# Patient Record
Sex: Female | Born: 1985 | Race: Asian | Hispanic: No | Marital: Married | Smoking: Never smoker
Health system: Southern US, Community
[De-identification: ages and names within clinical notes are randomized; demographics above are authoritative.]

## PROBLEM LIST (undated history)

## (undated) DIAGNOSIS — R51 Headache: Secondary | ICD-10-CM

## (undated) DIAGNOSIS — E559 Vitamin D deficiency, unspecified: Secondary | ICD-10-CM

## (undated) DIAGNOSIS — D649 Anemia, unspecified: Secondary | ICD-10-CM

## (undated) HISTORY — PX: NO PAST SURGERIES: SHX2092

---

## 2012-07-01 ENCOUNTER — Encounter (HOSPITAL_COMMUNITY): Payer: Self-pay

## 2012-07-11 NOTE — H&P (Signed)
Deborah Bennett is an 27 y.o. female presents for surgical management of a left adnexal mass  The patient initially presented to an outside clinic c/o left sided abdominal pain in March 2014.  An ultrasound was obtained that showed a left adnexal mass measuring 7x6x4 cm with no pelvic ascites.  A follow up ultrasound in my office redemonstrated the same adnexal mass.  Findings most likely represent an endometrioma.  Additionally the patient and her husband have been trying for pregnancy for > 6 months without success.  The patient has also been experiencing increased dysmenorrhea over the last several months.   She also has a FH of a sister with endometriosis. Given the US findings and dysmenorrhea the suspicion for an endometrioma and endometriosis is high.  Therefore, the patient presents for Diagnostic laparoscopy and ovarian cystectomy versus oophorectomy for suspected endometriosis with fulgeration of endometriosis and hysteroscopy D&C with chromopertubation for an abnormal appearing uterine cavity on [place] and to evaluate tubal patency   Menstrual History: Menarche age: No LMP recorded.    No past medical history on file.  No past surgical history on file.  No family history on file.  Social History:  has no tobacco, alcohol, and drug history on file.  Allergies: No Known Allergies  No prescriptions prior to admission    ROS: as above  Height 5\' 8"  (1.727 m), weight 73.936 kg (163 lb). Physical Exam  AOX3, NAD CTAB RRR Abd soft, slight tenderness with deep palpation on left   No results found for this or any previous visit (from the past 24 hour(s)).  No results found.  Assessment/Plan: 1) Surgical plan as delineated above  Joss Friedel H. 07/11/2012, 10:31 PM

## 2012-07-12 ENCOUNTER — Encounter (HOSPITAL_COMMUNITY)
Admission: RE | Disposition: A | Discharge status: Home / Self Care | Payer: Self-pay | Source: Ambulatory Visit | Attending: Obstetrics and Gynecology

## 2012-07-12 ENCOUNTER — Ambulatory Visit (HOSPITAL_COMMUNITY): Payer: BC Managed Care – PPO

## 2012-07-12 ENCOUNTER — Encounter (HOSPITAL_COMMUNITY): Payer: Self-pay | Admitting: *Deleted

## 2012-07-12 ENCOUNTER — Ambulatory Visit (HOSPITAL_COMMUNITY)
Admission: RE | Admit: 2012-07-12 | Discharge: 2012-07-12 | Disposition: A | Discharge status: Home / Self Care | Payer: BC Managed Care – PPO | Source: Ambulatory Visit | Attending: Obstetrics and Gynecology | Admitting: Obstetrics and Gynecology

## 2012-07-12 ENCOUNTER — Encounter (HOSPITAL_COMMUNITY): Payer: Self-pay

## 2012-07-12 DIAGNOSIS — N801 Endometriosis of ovary: Secondary | ICD-10-CM

## 2012-07-12 DIAGNOSIS — N946 Dysmenorrhea, unspecified: Secondary | ICD-10-CM | POA: Insufficient documentation

## 2012-07-12 DIAGNOSIS — N803 Endometriosis of pelvic peritoneum, unspecified: Secondary | ICD-10-CM | POA: Insufficient documentation

## 2012-07-12 DIAGNOSIS — N80109 Endometriosis of ovary, unspecified side, unspecified depth: Secondary | ICD-10-CM | POA: Insufficient documentation

## 2012-07-12 DIAGNOSIS — N949 Unspecified condition associated with female genital organs and menstrual cycle: Secondary | ICD-10-CM | POA: Insufficient documentation

## 2012-07-12 HISTORY — PX: LAPAROSCOPY: SHX197

## 2012-07-12 HISTORY — DX: Headache: R51

## 2012-07-12 HISTORY — PX: HYSTEROSCOPY WITH D & C: SHX1775

## 2012-07-12 HISTORY — DX: Anemia, unspecified: D64.9

## 2012-07-12 HISTORY — DX: Vitamin D deficiency, unspecified: E55.9

## 2012-07-12 HISTORY — PX: OTHER SURGICAL HISTORY: SHX169

## 2012-07-12 LAB — CBC
HCT: 37.2 % (ref 36.0–46.0)
MCV: 68 fL — ABNORMAL LOW (ref 78.0–100.0)
RBC: 5.47 MIL/uL — ABNORMAL HIGH (ref 3.87–5.11)
WBC: 6 10*3/uL (ref 4.0–10.5)

## 2012-07-12 SURGERY — DILATATION AND CURETTAGE /HYSTEROSCOPY
Anesthesia: General | Site: Vagina | Wound class: Clean Contaminated

## 2012-07-12 MED ORDER — KETOROLAC TROMETHAMINE 30 MG/ML IJ SOLN
15.0000 mg | Freq: Once | INTRAMUSCULAR | Status: AC | PRN
  Administered 2012-07-12: 30 mg via INTRAVENOUS

## 2012-07-12 MED ORDER — LACTATED RINGERS IR SOLN
Status: DC | PRN
  Administered 2012-07-12: 3000 mL

## 2012-07-12 MED ORDER — CEFAZOLIN SODIUM-DEXTROSE 2-3 GM-% IV SOLR
INTRAVENOUS | Status: AC
  Filled 2012-07-12: qty 50

## 2012-07-12 MED ORDER — LIDOCAINE HCL 1 % IJ SOLN
INTRAMUSCULAR | Status: DC | PRN
  Administered 2012-07-12: 10 mL

## 2012-07-12 MED ORDER — BUPIVACAINE HCL (PF) 0.25 % IJ SOLN
INTRAMUSCULAR | Status: AC
  Filled 2012-07-12: qty 30

## 2012-07-12 MED ORDER — MEPERIDINE HCL 25 MG/ML IJ SOLN: 6.2500 mg | INTRAMUSCULAR | Status: DC | PRN

## 2012-07-12 MED ORDER — NEOSTIGMINE METHYLSULFATE 1 MG/ML IJ SOLN
INTRAMUSCULAR | Status: DC | PRN
  Administered 2012-07-12: 5 mg via INTRAVENOUS

## 2012-07-12 MED ORDER — ROCURONIUM BROMIDE 100 MG/10ML IV SOLN
INTRAVENOUS | Status: DC | PRN
  Administered 2012-07-12: 5 mg via INTRAVENOUS
  Administered 2012-07-12: 40 mg via INTRAVENOUS
  Administered 2012-07-12: 10 mg via INTRAVENOUS

## 2012-07-12 MED ORDER — GLYCOPYRROLATE 0.2 MG/ML IJ SOLN
INTRAMUSCULAR | Status: AC
  Filled 2012-07-12: qty 5

## 2012-07-12 MED ORDER — BUPIVACAINE HCL (PF) 0.25 % IJ SOLN
INTRAMUSCULAR | Status: DC | PRN
  Administered 2012-07-12: 15 mL

## 2012-07-12 MED ORDER — DEXAMETHASONE SODIUM PHOSPHATE 10 MG/ML IJ SOLN
INTRAMUSCULAR | Status: DC | PRN
  Administered 2012-07-12: 10 mg via INTRAVENOUS

## 2012-07-12 MED ORDER — DEXAMETHASONE SODIUM PHOSPHATE 4 MG/ML IJ SOLN: INTRAMUSCULAR | Status: DC | PRN

## 2012-07-12 MED ORDER — MIDAZOLAM HCL 5 MG/5ML IJ SOLN
INTRAMUSCULAR | Status: DC | PRN
  Administered 2012-07-12: 2 mg via INTRAVENOUS

## 2012-07-12 MED ORDER — IBUPROFEN 600 MG PO TABS: 600.0000 mg | ORAL_TABLET | Freq: Four times a day (QID) | ORAL | Status: DC | PRN

## 2012-07-12 MED ORDER — LIDOCAINE HCL (CARDIAC) 20 MG/ML IV SOLN
INTRAVENOUS | Status: AC
  Filled 2012-07-12: qty 5

## 2012-07-12 MED ORDER — CEFAZOLIN SODIUM-DEXTROSE 2-3 GM-% IV SOLR
2.0000 g | INTRAVENOUS | Status: AC
  Administered 2012-07-12: 2 g via INTRAVENOUS

## 2012-07-12 MED ORDER — FENTANYL CITRATE 0.05 MG/ML IJ SOLN
INTRAMUSCULAR | Status: DC | PRN
  Administered 2012-07-12: 50 ug via INTRAVENOUS
  Administered 2012-07-12 (×2): 25 ug via INTRAVENOUS
  Administered 2012-07-12 (×5): 50 ug via INTRAVENOUS

## 2012-07-12 MED ORDER — ONDANSETRON HCL 4 MG/2ML IJ SOLN
INTRAMUSCULAR | Status: DC | PRN
  Administered 2012-07-12: 4 mg via INTRAVENOUS

## 2012-07-12 MED ORDER — PROPOFOL 10 MG/ML IV EMUL
INTRAVENOUS | Status: AC
  Filled 2012-07-12: qty 20

## 2012-07-12 MED ORDER — DEXAMETHASONE SODIUM PHOSPHATE 10 MG/ML IJ SOLN
INTRAMUSCULAR | Status: AC
  Filled 2012-07-12: qty 1

## 2012-07-12 MED ORDER — METOCLOPRAMIDE HCL 5 MG/ML IJ SOLN: 10.0000 mg | Freq: Once | INTRAMUSCULAR | Status: DC | PRN

## 2012-07-12 MED ORDER — FENTANYL CITRATE 0.05 MG/ML IJ SOLN: 25.0000 ug | INTRAMUSCULAR | Status: DC | PRN

## 2012-07-12 MED ORDER — GLYCOPYRROLATE 0.2 MG/ML IJ SOLN
INTRAMUSCULAR | Status: DC | PRN
  Administered 2012-07-12: 1 mg via INTRAVENOUS
  Administered 2012-07-12: 0.2 mg via INTRAVENOUS

## 2012-07-12 MED ORDER — KETOROLAC TROMETHAMINE 30 MG/ML IJ SOLN
INTRAMUSCULAR | Status: AC
  Filled 2012-07-12: qty 1

## 2012-07-12 MED ORDER — FENTANYL CITRATE 0.05 MG/ML IJ SOLN
INTRAMUSCULAR | Status: AC
  Filled 2012-07-12: qty 5

## 2012-07-12 MED ORDER — OXYCODONE-ACETAMINOPHEN 5-325 MG PO TABS: 2.0000 | ORAL_TABLET | ORAL | Status: DC | PRN

## 2012-07-12 MED ORDER — NEOSTIGMINE METHYLSULFATE 1 MG/ML IJ SOLN
INTRAMUSCULAR | Status: AC
  Filled 2012-07-12: qty 1

## 2012-07-12 MED ORDER — FENTANYL CITRATE 0.05 MG/ML IJ SOLN
INTRAMUSCULAR | Status: AC
  Filled 2012-07-12: qty 2

## 2012-07-12 MED ORDER — DOCUSATE SODIUM 100 MG PO CAPS: 100.0000 mg | ORAL_CAPSULE | Freq: Two times a day (BID) | ORAL | Status: DC

## 2012-07-12 MED ORDER — ROCURONIUM BROMIDE 50 MG/5ML IV SOLN
INTRAVENOUS | Status: AC
  Filled 2012-07-12: qty 1

## 2012-07-12 MED ORDER — GLYCINE 1.5 % IR SOLN
Status: DC | PRN
  Administered 2012-07-12: 3000 mL

## 2012-07-12 MED ORDER — METHYLENE BLUE 1 % INJ SOLN
INTRAMUSCULAR | Status: AC
  Filled 2012-07-12: qty 1

## 2012-07-12 MED ORDER — LACTATED RINGERS IV SOLN
INTRAVENOUS | Status: DC
  Administered 2012-07-12 (×3): via INTRAVENOUS

## 2012-07-12 MED ORDER — MIDAZOLAM HCL 2 MG/2ML IJ SOLN
INTRAMUSCULAR | Status: AC
  Filled 2012-07-12: qty 2

## 2012-07-12 MED ORDER — ONDANSETRON HCL 4 MG/2ML IJ SOLN
INTRAMUSCULAR | Status: AC
  Filled 2012-07-12: qty 2

## 2012-07-12 MED ORDER — PROPOFOL 10 MG/ML IV BOLUS
INTRAVENOUS | Status: DC | PRN
  Administered 2012-07-12: 200 mg via INTRAVENOUS

## 2012-07-12 SURGICAL SUPPLY — 37 items
ABLATOR ENDOMETRIAL BIPOLAR (ABLATOR) IMPLANT
CANISTER SUCTION 2500CC (MISCELLANEOUS) ×3 IMPLANT
CATH ROBINSON RED A/P 16FR (CATHETERS) ×3 IMPLANT
CLOTH BEACON ORANGE TIMEOUT ST (SAFETY) ×3 IMPLANT
CONT PATH 16OZ SNAP LID 3702 (MISCELLANEOUS) IMPLANT
CONTAINER PREFILL 10% NBF 60ML (FORM) ×6 IMPLANT
DECANTER SPIKE VIAL GLASS SM (MISCELLANEOUS) IMPLANT
DERMABOND ADVANCED (GAUZE/BANDAGES/DRESSINGS) ×1
DERMABOND ADVANCED .7 DNX12 (GAUZE/BANDAGES/DRESSINGS) ×2 IMPLANT
DILATOR CANAL MILEX (MISCELLANEOUS) IMPLANT
DRESSING TELFA 8X3 (GAUZE/BANDAGES/DRESSINGS) ×6 IMPLANT
ELECT REM PT RETURN 9FT ADLT (ELECTROSURGICAL)
ELECTRODE REM PT RTRN 9FT ADLT (ELECTROSURGICAL) IMPLANT
FORCEPS CUTTING 33CM 5MM (CUTTING FORCEPS) IMPLANT
FORCEPS CUTTING 45CM 5MM (CUTTING FORCEPS) IMPLANT
GLOVE BIO SURGEON STRL SZ7 (GLOVE) ×3 IMPLANT
GOWN PREVENTION PLUS LG XLONG (DISPOSABLE) ×6 IMPLANT
GOWN STRL REIN XL XLG (GOWN DISPOSABLE) ×6 IMPLANT
NEEDLE HYPO 22GX1.5 SAFETY (NEEDLE) IMPLANT
NEEDLE SPNL 22GX3.5 QUINCKE BK (NEEDLE) IMPLANT
NS IRRIG 1000ML POUR BTL (IV SOLUTION) ×3 IMPLANT
PACK HYSTEROSCOPY LF (CUSTOM PROCEDURE TRAY) ×3 IMPLANT
PACK LAPAROSCOPY BASIN (CUSTOM PROCEDURE TRAY) ×3 IMPLANT
PAD OB MATERNITY 4.3X12.25 (PERSONAL CARE ITEMS) ×3 IMPLANT
POUCH SPECIMEN RETRIEVAL 10MM (ENDOMECHANICALS) IMPLANT
PROTECTOR NERVE ULNAR (MISCELLANEOUS) ×3 IMPLANT
SCISSORS LAP 5X35 DISP (ENDOMECHANICALS) ×3 IMPLANT
SET IRRIG TUBING LAPAROSCOPIC (IRRIGATION / IRRIGATOR) ×3 IMPLANT
SUT VIC AB 3-0 PS2 18 (SUTURE) ×1
SUT VIC AB 3-0 PS2 18XBRD (SUTURE) ×2 IMPLANT
SUT VICRYL 0 UR6 27IN ABS (SUTURE) ×6 IMPLANT
TOWEL OR 17X24 6PK STRL BLUE (TOWEL DISPOSABLE) ×6 IMPLANT
TROCAR BALLN 12MMX100 BLUNT (TROCAR) ×3 IMPLANT
TROCAR OPTI TIP 5M 100M (ENDOMECHANICALS) ×6 IMPLANT
TROCAR XCEL OPT SLVE 5M 100M (ENDOMECHANICALS) ×6 IMPLANT
WARMER LAPAROSCOPE (MISCELLANEOUS) ×3 IMPLANT
WATER STERILE IRR 1000ML POUR (IV SOLUTION) ×3 IMPLANT

## 2012-07-12 NOTE — Anesthesia Postprocedure Evaluation (Signed)
  Anesthesia Post-op Note  Anesthesia Post Note  Patient: Deborah Bennett  Procedure(s) Performed: Procedure(s) (LRB): DILATATION AND CURETTAGE /HYSTEROSCOPY/CHROMTUBATION FULGERATION OF ENDOMETRIOSIS (N/A) LEFT OVARIAN CYSTECTOM WITH CHROMOPERTUBATION (Left)  Anesthesia type: General  Patient location: PACU  Post pain: Pain level controlled  Post assessment: Post-op Vital signs reviewed  Last Vitals:  Filed Vitals:   07/12/12 1445  BP:   Pulse: 77  Temp:   Resp: 12    Post vital signs: Reviewed  Level of consciousness: sedated  Complications: No apparent anesthesia complications

## 2012-07-12 NOTE — Anesthesia Preprocedure Evaluation (Addendum)
Anesthesia Evaluation  Patient identified by MRN, date of birth, ID band Patient awake    Reviewed: Allergy & Precautions, H&P , Patient's Chart, lab work & pertinent test results, reviewed documented beta blocker date and time   History of Anesthesia Complications Negative for: history of anesthetic complications  Airway Mallampati: II TM Distance: >3 FB Neck ROM: full    Dental no notable dental hx.    Pulmonary neg pulmonary ROS,  breath sounds clear to auscultation  Pulmonary exam normal       Cardiovascular Exercise Tolerance: Good negative cardio ROS  Rhythm:regular Rate:Normal     Neuro/Psych  Headaches, negative neurological ROS  negative psych ROS   GI/Hepatic negative GI ROS, Neg liver ROS,   Endo/Other  negative endocrine ROS  Renal/GU negative Renal ROS     Musculoskeletal   Abdominal   Peds  Hematology negative hematology ROS (+) anemia ,   Anesthesia Other Findings Anemia     Vitamin D deficiency disease        Headache(784.0)   otc med prn             Reproductive/Obstetrics negative OB ROS                           Anesthesia Physical Anesthesia Plan  ASA: II  Anesthesia Plan: General ETT   Post-op Pain Management:    Induction:   Airway Management Planned:   Additional Equipment:   Intra-op Plan:   Post-operative Plan:   Informed Consent: I have reviewed the patients History and Physical, chart, labs and discussed the procedure including the risks, benefits and alternatives for the proposed anesthesia with the patient or authorized representative who has indicated his/her understanding and acceptance.   Dental Advisory Given  Plan Discussed with: CRNA, Surgeon and Anesthesiologist  Anesthesia Plan Comments:        Anesthesia Quick Evaluation

## 2012-07-12 NOTE — Pre-Procedure Instructions (Signed)
No changes in PMH

## 2012-07-12 NOTE — Interval H&P Note (Signed)
History and Physical Interval Note:  07/12/2012 11:09 AM  Deborah Bennett  has presented today for surgery, with the diagnosis of PELVIC PAIN / LLQ PAIN  The various methods of treatment have been discussed with the patient and family. After consideration of risks, benefits and other options for treatment, the patient has consented to  Procedure(s) with comments: DILATATION AND CURETTAGE /HYSTEROSCOPY/CHROMTUBATION FULGERATION OF ENDOMETRIOSIS (N/A) LEFT OVARIAN CYSTECTOM WITH CHROMOPERTUBATION (Left) - WITH LEFT OVARIAN CYSTECTOMY as a surgical intervention .  [name] history has been reviewed, patient examined, no change in status, stable for surgery.  I have reviewed the patient's chart and labs.  Questions were answered to the patient's satisfaction.     Everett Ricciardelli H.

## 2012-07-12 NOTE — Transfer of Care (Signed)
Immediate Anesthesia Transfer of Care Note  Patient: Deborah Bennett  Procedure(s) Performed: Procedure(s) with comments: DILATATION AND CURETTAGE /HYSTEROSCOPY/CHROMTUBATION FULGERATION OF ENDOMETRIOSIS (N/A) LEFT OVARIAN CYSTECTOM WITH CHROMOPERTUBATION (Left) - WITH LEFT OVARIAN CYSTECTOMY  Patient Location: PACU  Anesthesia Type:General  Level of Consciousness: awake, alert  and oriented  Airway & Oxygen Therapy: Patient Spontanous Breathing and Patient connected to nasal cannula oxygen  Post-op Assessment: Report given to PACU RN and Post -op Vital signs reviewed and stable  Post vital signs: Reviewed and stable  Complications: No apparent anesthesia complications

## 2012-07-14 NOTE — Op Note (Signed)
Pre-Operative Diagnosis: 1) Left adnexal mass 2) pelvic pain Postoperative Diagnosis: 1) Left ovarian endometrioma Procedure: Laparoscopic left ovarian cystectomy, lysis of adhesions, hysteroscopy, dilation and curettage, chromopertubation Surgeon: Dr. Waynard Reeds Assistant: Dr. Donovan Kail Operative Findings: Endometriosis involving the left ovary with adhesion of the ovary to the posterior culdesac.  Endometriotic implants were seen in the posterior culdesac, and involving the peritoneum over the bladder. Fallopian tubes were patent bilaterally.  Bilateral tubal ostia were visualized hysteroscopically.  The endometrium had a thickened irregularly fluffy appearance. The shape of endometrial cavity was normal/ Specimen: Left adnexal cyst and endometrial curettings EBL: 100cc  Deborah Bennett is a 27 yo G0 female who was diagnosed With a left adnexal mass in March 2014  After she underwent a ultrasound for pelvic pain. When I saw the patient for her first visit in June 2014 in repeat pelvic ultrasound showed persistence of the pelvis mass on the left ovary.  Additionally, it showed a possible arcuate shaped uterine cavity. The patient has been experiencing cyclic pelvic pain for the past six months. She also desires pregnancy  And have been having irregular cycles.  Following the appropriate informed consent the patient was taken to the operating room where she was placed in the dorsal body position and Stratford stirrups.  General anesthesia was administered. She was prepped and draped in the normal sterile fashion. A speculum was placed in the vagina and a single tooth connector was placed on the Internet of the cervix. 10 mL of 1% lidocaine or injected in a paracervical fashion. The cervix was serially diluted with Hank dilators. The history scope was then introduced. The endometrial cavity was noted to have a irregularly fluffy appearance. Bilateral tubal ostia were visualized.  The hysteroscope was removed and a  dilation and curettage was performed.  And acorn uterine manipulator was then placed the cervix. This completed the vaginal portion of the case. Gowns and gloves were changed  And attention was turned to the abdominal portion of the case.  The info on the local skin was grasped with Alice clamps and 10 mL of 0.25% Marcaine were injected infraumbilically.  A vertical infra-umbilical skin incision was made.  The underlying soft tissue was dissected bluntly and the fascia was grasped with coker clamps, tented up, and entered sharply with Mayo scissors.  Intra-abdominal entry was confirmed direct visualization of abdominal contents. The Hasson port was then introduced. The peritoneal cavity was inspected.  The large and small bowel, liver, gallbladder and stomach, and the Appendix were normal.  In the Pelvis, the right tube and ovary were normal.  The left ovary was enlarged and adherent to the posterior culdesac. Endometriotic implants were noted in the posterior culdesac, involving both tubes, and the bladder. Extensive excision of the endometriosis was not undertaken. Three additional 5mm trocars were inserted under direct visualization in the right and left lower quadrants and suprapubic.Chromopertubation was performed and dye was noted to spill from bilateral tubes. The left ovary was elevated and an incision was made in the ovarian cortex over the adnexal cyst.  The cyst cavity was entered and chocolate cyst fluid emanated. The wall of the cyst was grasped and teased out of the ovary. Once the cyst was removed, the ovary was cauterized for hemostasis. The pelvis was copiously irrigated. The trocars were removed and hemostatsis at their insertion site was confirmed. The fascia of the umbilical port site was closed with 0 vicryl in a running fashion and the skin was closed with 4-0  vicryl in a subcuticular fashion and dermabond. The skin of the remaining 5 mm port sites was closed with single interrupted  subcuticular stitches and dermabond. The acorn uterine manipulator was removed from the vagina and the foley catheter was removed from the bladder.  All sponge, lap, and needle counts were correct x 2. The patient tolerated the procedure well and was brought to the recovery room in stable condition

## 2012-07-15 ENCOUNTER — Encounter (HOSPITAL_COMMUNITY): Payer: Self-pay | Admitting: Obstetrics and Gynecology

## 2012-10-07 ENCOUNTER — Other Ambulatory Visit: Payer: Self-pay | Admitting: Obstetrics and Gynecology

## 2012-10-25 LAB — OB RESULTS CONSOLE RPR: RPR: NONREACTIVE

## 2012-10-25 LAB — OB RESULTS CONSOLE GC/CHLAMYDIA
Chlamydia: NEGATIVE
Gonorrhea: NEGATIVE

## 2012-10-25 LAB — OB RESULTS CONSOLE ANTIBODY SCREEN: ANTIBODY SCREEN: NEGATIVE

## 2012-10-25 LAB — OB RESULTS CONSOLE ABO/RH: RH TYPE: POSITIVE

## 2012-10-25 LAB — OB RESULTS CONSOLE HEPATITIS B SURFACE ANTIGEN: HEP B S AG: NEGATIVE

## 2012-10-25 LAB — OB RESULTS CONSOLE HIV ANTIBODY (ROUTINE TESTING): HIV: NONREACTIVE

## 2012-10-25 LAB — OB RESULTS CONSOLE RUBELLA ANTIBODY, IGM: Rubella: IMMUNE

## 2013-01-02 NOTE — L&D Delivery Note (Signed)
Patient was C/C/+2 and pushed for 90 minutes with epidural.    Pt pushing well but frustrated and not progressing past +4 station. Offered elective VE assist and d/w her all R/B/Alt- she accepted. VE applied alternating with no VE pushing for a total of 3 VE contractions and 3 popoffs; total time addtitional 15 minutes. NSVD  female infant, Apgars 8,9, weight P.   The patient had a second degree episiotomy without extension repaired with 2-0 vicryl R. Fundus was firm. EBL was expected. Placenta was delivered intact. Vagina was clear.  Baby was vigorous and doing skin to skin with mother.  Loney Laurence

## 2013-04-24 LAB — OB RESULTS CONSOLE GBS: STREP GROUP B AG: POSITIVE

## 2013-05-18 ENCOUNTER — Inpatient Hospital Stay (HOSPITAL_COMMUNITY)
Admission: AD | Admit: 2013-05-18 | Payer: BC Managed Care – PPO | Source: Ambulatory Visit | Admitting: Obstetrics and Gynecology

## 2013-05-21 ENCOUNTER — Telehealth (HOSPITAL_COMMUNITY): Payer: Self-pay | Admitting: *Deleted

## 2013-05-21 ENCOUNTER — Encounter (HOSPITAL_COMMUNITY): Payer: Self-pay | Admitting: *Deleted

## 2013-05-21 NOTE — Telephone Encounter (Signed)
Preadmission screen  

## 2013-05-23 ENCOUNTER — Encounter (HOSPITAL_COMMUNITY): Payer: Self-pay

## 2013-05-23 ENCOUNTER — Inpatient Hospital Stay (HOSPITAL_COMMUNITY): Payer: BC Managed Care – PPO | Admitting: Anesthesiology

## 2013-05-23 ENCOUNTER — Inpatient Hospital Stay (HOSPITAL_COMMUNITY)
Admission: RE | Admit: 2013-05-23 | Discharge: 2013-05-25 | DRG: 775 | Disposition: A | Discharge status: Home / Self Care | Payer: BC Managed Care – PPO | Source: Ambulatory Visit | Attending: Obstetrics and Gynecology | Admitting: Obstetrics and Gynecology

## 2013-05-23 ENCOUNTER — Encounter (HOSPITAL_COMMUNITY): Payer: Self-pay | Admitting: Anesthesiology

## 2013-05-23 ENCOUNTER — Encounter (HOSPITAL_COMMUNITY): Payer: BC Managed Care – PPO | Admitting: Anesthesiology

## 2013-05-23 DIAGNOSIS — Z22330 Carrier of Group B streptococcus: Secondary | ICD-10-CM

## 2013-05-23 DIAGNOSIS — O99892 Other specified diseases and conditions complicating childbirth: Secondary | ICD-10-CM | POA: Diagnosis present

## 2013-05-23 DIAGNOSIS — D649 Anemia, unspecified: Secondary | ICD-10-CM | POA: Diagnosis present

## 2013-05-23 DIAGNOSIS — O9989 Other specified diseases and conditions complicating pregnancy, childbirth and the puerperium: Secondary | ICD-10-CM

## 2013-05-23 DIAGNOSIS — O480 Post-term pregnancy: Principal | ICD-10-CM | POA: Diagnosis present

## 2013-05-23 DIAGNOSIS — Z3490 Encounter for supervision of normal pregnancy, unspecified, unspecified trimester: Secondary | ICD-10-CM

## 2013-05-23 DIAGNOSIS — O9902 Anemia complicating childbirth: Secondary | ICD-10-CM | POA: Diagnosis present

## 2013-05-23 DIAGNOSIS — E559 Vitamin D deficiency, unspecified: Secondary | ICD-10-CM | POA: Diagnosis present

## 2013-05-23 LAB — CBC
HCT: 35.1 % — ABNORMAL LOW (ref 36.0–46.0)
HEMOGLOBIN: 11.2 g/dL — AB (ref 12.0–15.0)
MCH: 22.7 pg — AB (ref 26.0–34.0)
MCHC: 31.9 g/dL (ref 30.0–36.0)
MCV: 71.2 fL — ABNORMAL LOW (ref 78.0–100.0)
Platelets: 187 10*3/uL (ref 150–400)
RBC: 4.93 MIL/uL (ref 3.87–5.11)
RDW: 16.8 % — AB (ref 11.5–15.5)
WBC: 10.3 10*3/uL (ref 4.0–10.5)

## 2013-05-23 LAB — TYPE AND SCREEN
ABO/RH(D): B POS
Antibody Screen: NEGATIVE

## 2013-05-23 LAB — ABO/RH: ABO/RH(D): B POS

## 2013-05-23 LAB — RPR

## 2013-05-23 MED ORDER — DIPHENHYDRAMINE HCL 50 MG/ML IJ SOLN: 12.5000 mg | INTRAMUSCULAR | Status: DC | PRN

## 2013-05-23 MED ORDER — PHENYLEPHRINE 40 MCG/ML (10ML) SYRINGE FOR IV PUSH (FOR BLOOD PRESSURE SUPPORT)
80.0000 ug | PREFILLED_SYRINGE | INTRAVENOUS | Status: DC | PRN
  Filled 2013-05-23: qty 10

## 2013-05-23 MED ORDER — OXYCODONE-ACETAMINOPHEN 5-325 MG PO TABS: 1.0000 | ORAL_TABLET | ORAL | Status: DC | PRN

## 2013-05-23 MED ORDER — ONDANSETRON HCL 4 MG/2ML IJ SOLN: 4.0000 mg | Freq: Four times a day (QID) | INTRAMUSCULAR | Status: DC | PRN

## 2013-05-23 MED ORDER — OXYTOCIN 40 UNITS IN LACTATED RINGERS INFUSION - SIMPLE MED
1.0000 m[IU]/min | INTRAVENOUS | Status: DC
  Administered 2013-05-23: 2 m[IU]/min via INTRAVENOUS
  Administered 2013-05-23: 10 m[IU]/min via INTRAVENOUS
  Filled 2013-05-23: qty 1000

## 2013-05-23 MED ORDER — OXYTOCIN 40 UNITS IN LACTATED RINGERS INFUSION - SIMPLE MED: 62.5000 mL/h | INTRAVENOUS | Status: DC

## 2013-05-23 MED ORDER — PENICILLIN G POTASSIUM 5000000 UNITS IJ SOLR
5.0000 10*6.[IU] | Freq: Once | INTRAVENOUS | Status: AC
  Administered 2013-05-23: 5 10*6.[IU] via INTRAVENOUS
  Filled 2013-05-23: qty 5

## 2013-05-23 MED ORDER — EPHEDRINE 5 MG/ML INJ
10.0000 mg | INTRAVENOUS | Status: DC | PRN
  Filled 2013-05-23: qty 4

## 2013-05-23 MED ORDER — LACTATED RINGERS IV SOLN: 500.0000 mL | Freq: Once | INTRAVENOUS | Status: DC

## 2013-05-23 MED ORDER — LIDOCAINE HCL (PF) 1 % IJ SOLN
30.0000 mL | INTRAMUSCULAR | Status: DC | PRN
  Filled 2013-05-23: qty 30

## 2013-05-23 MED ORDER — CITRIC ACID-SODIUM CITRATE 334-500 MG/5ML PO SOLN: 30.0000 mL | ORAL | Status: DC | PRN

## 2013-05-23 MED ORDER — OXYTOCIN BOLUS FROM INFUSION: 500.0000 mL | INTRAVENOUS | Status: DC

## 2013-05-23 MED ORDER — BUTORPHANOL TARTRATE 1 MG/ML IJ SOLN: 1.0000 mg | INTRAMUSCULAR | Status: DC | PRN

## 2013-05-23 MED ORDER — LACTATED RINGERS IV SOLN
500.0000 mL | INTRAVENOUS | Status: DC | PRN
Start: 2013-05-23 — End: 2013-05-24
  Administered 2013-05-23: 500 mL via INTRAVENOUS

## 2013-05-23 MED ORDER — TERBUTALINE SULFATE 1 MG/ML IJ SOLN: 0.2500 mg | Freq: Once | INTRAMUSCULAR | Status: AC | PRN

## 2013-05-23 MED ORDER — PENICILLIN G POTASSIUM 5000000 UNITS IJ SOLR
2.5000 10*6.[IU] | INTRAMUSCULAR | Status: DC
  Administered 2013-05-23 (×3): 2.5 10*6.[IU] via INTRAVENOUS
  Filled 2013-05-23 (×8): qty 2.5

## 2013-05-23 MED ORDER — LACTATED RINGERS IV SOLN
INTRAVENOUS | Status: DC
  Administered 2013-05-23 (×2): via INTRAVENOUS

## 2013-05-23 MED ORDER — FLEET ENEMA 7-19 GM/118ML RE ENEM: 1.0000 | ENEMA | RECTAL | Status: DC | PRN

## 2013-05-23 MED ORDER — ACETAMINOPHEN 325 MG PO TABS: 650.0000 mg | ORAL_TABLET | ORAL | Status: DC | PRN

## 2013-05-23 MED ORDER — FENTANYL 2.5 MCG/ML BUPIVACAINE 1/10 % EPIDURAL INFUSION (WH - ANES)
14.0000 mL/h | INTRAMUSCULAR | Status: DC | PRN
  Administered 2013-05-23: 14 mL/h via EPIDURAL
  Filled 2013-05-23: qty 125

## 2013-05-23 MED ORDER — IBUPROFEN 600 MG PO TABS: 600.0000 mg | ORAL_TABLET | Freq: Four times a day (QID) | ORAL | Status: DC | PRN

## 2013-05-23 MED ORDER — LIDOCAINE HCL (PF) 1 % IJ SOLN
INTRAMUSCULAR | Status: DC | PRN
  Administered 2013-05-23 (×4): 4 mL

## 2013-05-23 MED ORDER — PHENYLEPHRINE 40 MCG/ML (10ML) SYRINGE FOR IV PUSH (FOR BLOOD PRESSURE SUPPORT): 80.0000 ug | PREFILLED_SYRINGE | INTRAVENOUS | Status: DC | PRN

## 2013-05-23 NOTE — Anesthesia Procedure Notes (Signed)
Epidural Patient location during procedure: OB Start time: 05/23/2013 4:14 PM  Staffing Performed by: anesthesiologist   Preanesthetic Checklist Completed: patient identified, site marked, surgical consent, pre-op evaluation, timeout performed, IV checked, risks and benefits discussed and monitors and equipment checked  Epidural Patient position: sitting Prep: site prepped and draped and DuraPrep Patient monitoring: continuous pulse ox and blood pressure Approach: midline Injection technique: LOR air  Needle:  Needle type: Tuohy  Needle gauge: 17 G Needle length: 9 cm and 9 Needle insertion depth: 6.5 cm Catheter type: closed end flexible Catheter size: 19 Gauge Catheter at skin depth: 11.5 cm Test dose: negative  Assessment Events: blood not aspirated, injection not painful, no injection resistance, negative IV test and no paresthesia  Additional Notes Discussed risk of headache, infection, bleeding, nerve injury and failed or incomplete block.  Patient voices understanding and wishes to proceed.  Epidural placed easily on first attempt.  No paresthesia.  Patient tolerated procedure well with no apparent complications.  Jasmine December, MDReason for block:procedure for pain

## 2013-05-23 NOTE — Anesthesia Preprocedure Evaluation (Addendum)

## 2013-05-23 NOTE — H&P (Signed)
28 y.o. [redacted]w[redacted]d  G1P0 comes in for post dates induction.  Otherwise has good fetal movement and no bleeding.  Past Medical History  Diagnosis Date  . Anemia   . Vitamin D deficiency disease   . Headache(784.0)     otc med prn    Past Surgical History  Procedure Laterality Date  . Hysteroscopy w/d&c N/A 07/12/2012    Procedure: DILATATION AND CURETTAGE /HYSTEROSCOPY/CHROMTUBATION FULGERATION OF ENDOMETRIOSIS;  Surgeon: Freddrick March. Tenny Craw, MD;  Location: [facility];  Service: Gynecology;  Laterality: N/A;  . Laparoscopy Left 07/12/2012    Procedure: LEFT OVARIAN CYSTECTOM WITH CHROMOPERTUBATION;  Surgeon: Freddrick March. Tenny Craw, MD;  Location: [facility];  Service: Gynecology;  Laterality: Left;  WITH LEFT OVARIAN CYSTECTOMY  . No past surgeries    . Other surgical history Left 07/12/2012    cyst removed from ovary    OB History  Gravida Para Term Preterm AB SAB TAB Ectopic Multiple Living  1             # Outcome Date GA Lbr Len/2nd Weight Sex Delivery Anes PTL Lv  1 CUR               History   Social History  . Marital Status: Married    Spouse Name: N/A    Number of Children: N/A  . Years of Education: N/A   Occupational History  . Not on file.   Social History Main Topics  . Smoking status: Never Smoker   . Smokeless tobacco: Never Used  . Alcohol Use: No  . Drug Use: No  . Sexual Activity: Yes    Birth Control/ Protection: None   Other Topics Concern  . Not on file   Social History Narrative  . No narrative on file   Betadine    Prenatal Transfer Tool  Maternal Diabetes: No Genetic Screening: Normal- NIPT <10,000 Maternal Ultrasounds/Referrals: Normal Fetal Ultrasounds or other Referrals:  None Maternal Substance Abuse:  No Significant Maternal Medications:  None Significant Maternal Lab Results: None  Other PNC: uncomplicated.    Filed Vitals:   05/23/13 0730  BP: 130/84  Pulse: 108  Temp: 97.8 F (36.6 C)  Resp: 18     Lungs/Cor:  NAD Abdomen:  soft,  gravid Ex:  no cords, erythema SVE:  3/70/-2; AROM clear FHTs:  120, good STV, NST R Toco:  q occ   A/P   Post dates induction.  GBS POS- PCN.  Loney Laurence

## 2013-05-24 ENCOUNTER — Encounter (HOSPITAL_COMMUNITY): Payer: Self-pay

## 2013-05-24 LAB — CBC
HCT: 33.6 % — ABNORMAL LOW (ref 36.0–46.0)
HEMOGLOBIN: 10.7 g/dL — AB (ref 12.0–15.0)
MCH: 22.8 pg — ABNORMAL LOW (ref 26.0–34.0)
MCHC: 31.8 g/dL (ref 30.0–36.0)
MCV: 71.6 fL — ABNORMAL LOW (ref 78.0–100.0)
Platelets: 155 10*3/uL (ref 150–400)
RBC: 4.69 MIL/uL (ref 3.87–5.11)
RDW: 16.7 % — ABNORMAL HIGH (ref 11.5–15.5)
WBC: 19.7 10*3/uL — AB (ref 4.0–10.5)

## 2013-05-24 MED ORDER — SIMETHICONE 80 MG PO CHEW: 80.0000 mg | CHEWABLE_TABLET | ORAL | Status: DC | PRN

## 2013-05-24 MED ORDER — DIBUCAINE 1 % RE OINT
1.0000 "application " | TOPICAL_OINTMENT | RECTAL | Status: DC | PRN
  Administered 2013-05-24: 1 via RECTAL
  Filled 2013-05-24 (×2): qty 28

## 2013-05-24 MED ORDER — BENZOCAINE-MENTHOL 20-0.5 % EX AERO
1.0000 "application " | INHALATION_SPRAY | CUTANEOUS | Status: DC | PRN
  Administered 2013-05-24: 1 via TOPICAL
  Filled 2013-05-24 (×2): qty 56

## 2013-05-24 MED ORDER — DIPHENHYDRAMINE HCL 25 MG PO CAPS: 25.0000 mg | ORAL_CAPSULE | Freq: Four times a day (QID) | ORAL | Status: DC | PRN

## 2013-05-24 MED ORDER — MEASLES, MUMPS & RUBELLA VAC ~~LOC~~ INJ
0.5000 mL | INJECTION | Freq: Once | SUBCUTANEOUS | Status: DC
  Filled 2013-05-24: qty 0.5

## 2013-05-24 MED ORDER — IBUPROFEN 800 MG PO TABS
800.0000 mg | ORAL_TABLET | Freq: Three times a day (TID) | ORAL | Status: DC
  Administered 2013-05-24 – 2013-05-25 (×5): 800 mg via ORAL
  Filled 2013-05-24 (×5): qty 1

## 2013-05-24 MED ORDER — FERROUS SULFATE 325 (65 FE) MG PO TABS
325.0000 mg | ORAL_TABLET | Freq: Two times a day (BID) | ORAL | Status: DC
  Administered 2013-05-24 – 2013-05-25 (×2): 325 mg via ORAL
  Filled 2013-05-24 (×4): qty 1

## 2013-05-24 MED ORDER — WITCH HAZEL-GLYCERIN EX PADS
1.0000 "application " | MEDICATED_PAD | CUTANEOUS | Status: DC | PRN
  Administered 2013-05-24: 1 via TOPICAL

## 2013-05-24 MED ORDER — SODIUM CHLORIDE 0.9 % IJ SOLN: 3.0000 mL | INTRAMUSCULAR | Status: DC | PRN

## 2013-05-24 MED ORDER — SODIUM CHLORIDE 0.9 % IV SOLN: 250.0000 mL | INTRAVENOUS | Status: DC | PRN

## 2013-05-24 MED ORDER — SENNOSIDES-DOCUSATE SODIUM 8.6-50 MG PO TABS
2.0000 | ORAL_TABLET | ORAL | Status: DC
  Administered 2013-05-24: 2 via ORAL
  Filled 2013-05-24 (×2): qty 2

## 2013-05-24 MED ORDER — METHYLERGONOVINE MALEATE 0.2 MG/ML IJ SOLN: 0.2000 mg | INTRAMUSCULAR | Status: DC | PRN

## 2013-05-24 MED ORDER — MAGNESIUM HYDROXIDE 400 MG/5ML PO SUSP: 30.0000 mL | ORAL | Status: DC | PRN

## 2013-05-24 MED ORDER — LANOLIN HYDROUS EX OINT: TOPICAL_OINTMENT | CUTANEOUS | Status: DC | PRN

## 2013-05-24 MED ORDER — ONDANSETRON HCL 4 MG PO TABS: 4.0000 mg | ORAL_TABLET | ORAL | Status: DC | PRN

## 2013-05-24 MED ORDER — PRENATAL MULTIVITAMIN CH
1.0000 | ORAL_TABLET | Freq: Every day | ORAL | Status: DC
  Administered 2013-05-24 – 2013-05-25 (×2): 1 via ORAL
  Filled 2013-05-24 (×2): qty 1

## 2013-05-24 MED ORDER — SODIUM CHLORIDE 0.9 % IJ SOLN: 3.0000 mL | Freq: Two times a day (BID) | INTRAMUSCULAR | Status: DC

## 2013-05-24 MED ORDER — METHYLERGONOVINE MALEATE 0.2 MG PO TABS: 0.2000 mg | ORAL_TABLET | ORAL | Status: DC | PRN

## 2013-05-24 MED ORDER — ZOLPIDEM TARTRATE 5 MG PO TABS: 5.0000 mg | ORAL_TABLET | Freq: Every evening | ORAL | Status: DC | PRN

## 2013-05-24 MED ORDER — ONDANSETRON HCL 4 MG/2ML IJ SOLN: 4.0000 mg | INTRAMUSCULAR | Status: DC | PRN

## 2013-05-24 MED ORDER — TETANUS-DIPHTH-ACELL PERTUSSIS 5-2.5-18.5 LF-MCG/0.5 IM SUSP
0.5000 mL | Freq: Once | INTRAMUSCULAR | Status: DC
  Filled 2013-05-24: qty 0.5

## 2013-05-24 MED ORDER — OXYCODONE-ACETAMINOPHEN 5-325 MG PO TABS: 1.0000 | ORAL_TABLET | ORAL | Status: DC | PRN

## 2013-05-24 NOTE — Anesthesia Postprocedure Evaluation (Signed)
Anesthesia Post Note  Patient: Deborah Bennett  Procedure(s) Performed: * No procedures listed *  Anesthesia type: Epidural  Patient location: Mother/Baby  Post pain: Pain level controlled  Post assessment: Post-op Vital signs reviewed  Last Vitals:  Filed Vitals:   05/24/13 0821  BP: 102/61  Pulse: 89  Temp: 36.5 C  Resp: 18    Post vital signs: Reviewed  Level of consciousness:alert  Complications: No apparent anesthesia complications

## 2013-05-24 NOTE — Progress Notes (Signed)
Patient is eating, ambulating, voiding.  Pain control is good.  Filed Vitals:   05/24/13 0046 05/24/13 0115 05/24/13 0214 05/24/13 0318  BP: 127/70 122/77 118/75 119/68  Pulse: 98 96 91 95  Temp:   98.8 F (37.1 C) 98.1 F (36.7 C)  TempSrc:   Oral Oral  Resp:   18 18  Height:      Weight:      SpO2:        Fundus firm Perineum without swelling.  Lab Results  Component Value Date   WBC 10.3 05/23/2013   HGB 11.2* 05/23/2013   HCT 35.1* 05/23/2013   MCV 71.2* 05/23/2013   PLT 187 05/23/2013    --/--/B POS, B POS (05/22 0745)/RI  A/P Post partum day 1/2.  Routine care.  Expect d/c routine.    Loney Laurence

## 2013-05-24 NOTE — Lactation Note (Signed)
This note was copied from the chart of Deborah [facility]. Lactation Consultation Note  Patient Name: Deborah Bennett WCHEN'I Date: 05/24/2013 Reason for consult: Initial assessment Baby 20 hours of life. Patient's MBU nurse consulted with LC about spoon feeding baby 1/2-1 ml of EBM. Stated that mom was having difficulty latching baby. When [facility] visited mom, she states that baby is able to latch but fell asleep at breast. Mom believes it was because she was tired from bath. Enc mom to let [facility] assist with latching the baby, mom refused. Company came in and mom asked if [facility] would come back later. Told mom to call out when she was ready to latch baby, and that if LC could not come, her MBU nurse could assist with latch. Mom states that she is using breast shells, enc mom to be assessed to see if her nipples were everting more. Mom stated again that baby is able to latch. LC discussed with mom that the goal is to get baby to stay latched and nurse. Enc lots of STS and attempts at latching and hand expression. Mom given LC brochurce, aware of OP/BFSG and community resources.   Maternal Data Infant to breast within first hour of birth: Yes Has patient been taught Hand Expression?: Yes Does the patient have breastfeeding experience prior to this delivery?: No  Feeding Feeding Type:  (Mom does not want to latch baby at this time. States that she tried to latch baby 20 minutes ago and baby latched but then fell asleep. Discussed with mom that it might be that the baby went to sleep because she could not latch well. Mom said no.)  LATCH Score/Interventions                      Lactation Tools Discussed/Used Tools: Shells   Consult Status Consult Status: Follow-up Follow-up type: In-patient    Sherlyn Hay 05/24/2013, 8:19 PM

## 2013-05-25 NOTE — Discharge Summary (Signed)
Obstetric Discharge Summary Reason for Admission: induction of labor Prenatal Procedures: none Intrapartum Procedures: vacuum Postpartum Procedures: none Complications-Operative and Postpartum: 2 degree perineal laceration Hemoglobin  Date Value Ref Range Status  05/24/2013 10.7* 12.0 - 15.0 g/dL Final     HCT  Date Value Ref Range Status  05/24/2013 33.6* 36.0 - 46.0 % Final    Discharge Diagnoses: Term Pregnancy-delivered  Discharge Information: Date: 05/25/2013 Activity: pelvic rest Diet: routine Medications: Ibuprofen Condition: stable Instructions: refer to practice specific booklet Discharge to: home Follow-up Information   Follow up with Leialoha Hanna A, MD In 4 weeks.   Specialty:  Obstetrics and Gynecology   Contact information:   [address] [place] [address] [phone]       Newborn Data: Live born female  Birth Weight: 8 lb 3.4 oz (3725 g) APGAR: 8, 9  Home with mother.  Loney Laurence 05/25/2013, 9:38 AM

## 2013-05-25 NOTE — Lactation Note (Signed)
This note was copied from the chart of Girl [facility]. Lactation Consultation Note Follow up consult: Mother wearing comfort gels. Mother has positional stripes on both breasts. Flat nipples. Mother placed baby in cross cradle hold.  Baby has a distinct recessed chin. Baby tucks bottom lip under.  Reviewed with mother & father how to check for flanged bottom lip and pull down on chin after latching. Sucks and swallows observed.   Demonstrated to mother how to wear shells after feeding to help evert nipples. Reviewed supply and demand and encouraged mother to call for further assistance.  Patient Name: Girl Brielle Ozanne ZSMOL'M Date: 05/25/2013 Reason for consult: Follow-up assessment   Maternal Data    Feeding Feeding Type: Breast Fed Nipple Type: Slow - flow Length of feed: 30 min  LATCH Score/Interventions Latch: Grasps breast easily, tongue down, lips flanged, rhythmical sucking.  Audible Swallowing: A few with stimulation  Type of Nipple: Flat Intervention(s): Shells;Hand pump  Comfort (Breast/Nipple): Filling, red/small blisters or bruises, mild/mod discomfort  Problem noted: Cracked, bleeding, blisters, bruises Interventions  (Cracked/bleeding/bruising/blister): Expressed breast milk to nipple;Hand pump  Hold (Positioning): No assistance needed to correctly position infant at breast. Intervention(s): Breastfeeding basics reviewed  LATCH Score: 7  Lactation Tools Discussed/Used Tools: Comfort gels   Consult Status Consult Status: Follow-up Date: 05/26/13 Follow-up type: In-patient    Dulce Sellar Jefrey Raburn 05/25/2013, 11:19 AM

## 2013-05-25 NOTE — Progress Notes (Signed)
Patient is eating, ambulating, voiding.  Pain control is good.  Filed Vitals:   05/24/13 0318 05/24/13 0821 05/24/13 1850 05/25/13 0515  BP: 119/68 102/61 131/74 115/75  Pulse: 95 89 91 85  Temp: 98.1 F (36.7 C) 97.7 F (36.5 C) 97.8 F (36.6 C) 97.7 F (36.5 C)  TempSrc: Oral Oral Oral Oral  Resp: 18 18 18 18   Height:      Weight:      SpO2:        Fundus firm Perineum without swelling.  Lab Results  Component Value Date   WBC 19.7* 05/24/2013   HGB 10.7* 05/24/2013   HCT 33.6* 05/24/2013   MCV 71.6* 05/24/2013   PLT 155 05/24/2013    --/--/B POS, B POS (05/22 0745)/RI  A/P Post partum day 1.  Routine care.  Expect d/c routine.    Loney Laurence

## 2013-05-26 ENCOUNTER — Ambulatory Visit: Payer: Self-pay

## 2013-05-26 NOTE — Lactation Note (Signed)
This note was copied from the chart of Deborah [facility]. Lactation Consultation Note Follow up visit at 69 hours of age.  Mom reports baby just fed, but is fussy.  Attempted latch and mom is complaining of a lot of pain.  Nipples are sore, blistered and cracked with positional stripe/bruise on right nipple.  Mom's breast are large and full, nipples are short and semi compressible.  Breasts are filling and baby is not feeding well at breast with few sucks.  Allowed baby to suck on gloved finger and discussed pumping with DEBP with mom and FOB.  Baby settled with sucking for a few minutes and went to sleep.  Baby is on double photo therapy with jaundice levels down at last check. Mom is supplementing with formula in bottles due to jaundice and not feeling she has enough milk.  Mom is using comfort gels.  Set up DEBP with instructions.  Massaged full breasts during pumping and hand expressed for several minutes to soften breasts.  Engorgement treatment discussed.  of colostrum collected.  Mom is feeling encouraged.  Plan is to use DEBP every 3 hours tonight and bottle feed to allow breast a break and chance to heal.  Baby will get colostrum first and then formula to measure up to 28-78mls per feeding.  Mom wants to re attempt latching baby in the morning and is anticipating discharge. Mom to call for assist as needed.     Patient Name: Deborah Bennett [id number]'Q Date: 05/26/2013 Reason for consult: Follow-up assessment;Difficult latch;Breast/nipple pain;Hyperbilirubinemia   Maternal Data    Feeding Feeding Type: Breast Fed Length of feed:  (few sucks)  LATCH Score/Interventions Latch: Repeated attempts needed to sustain latch, nipple held in mouth throughout feeding, stimulation needed to elicit sucking reflex. Intervention(s): Adjust position;Assist with latch;Breast compression  Audible Swallowing: A few with stimulation Intervention(s): Skin to skin;Hand expression  Type of Nipple:  Everted at rest and after stimulation  Comfort (Breast/Nipple): Filling, red/small blisters or bruises, mild/mod discomfort  Problem noted: Mild/Moderate discomfort;Cracked, bleeding, blisters, bruises;Filling Interventions (Filling): Double electric pump Interventions (Mild/moderate discomfort): Comfort gels  Hold (Positioning): No assistance needed to correctly position infant at breast. Intervention(s): Breastfeeding basics reviewed;Support Pillows;Position options;Skin to skin  LATCH Score: 7  Lactation Tools Discussed/Used Tools: Shells Pump Review: Setup, frequency, and cleaning;Milk Storage Initiated by:: JS Date initiated:: 05/26/13   Consult Status Consult Status: Follow-up Date: 05/27/13 Follow-up type: In-patient    Deborah Bennett 05/26/2013, 10:19 PM

## 2013-05-27 ENCOUNTER — Ambulatory Visit: Payer: Self-pay

## 2013-05-27 NOTE — Lactation Note (Signed)
This note was copied from the chart of Deborah [facility]. Lactation Consultation Note  Patient Name: Deborah Bennett [id number] Date: 05/27/2013 Reason for consult: Follow-up assessment;Breast/nipple pain;Pump rental Mom has been pumping and bottle feeding since last evening due to nipple pain. Both nipples are bruised, excoriated, red. No cracking or bleeding observed. Mom is becoming engorged. Assisted Mom with pre-pumping using hand pump to soften aerola due to engorgement on the left breast. Assisted with latching baby in cross cradle hold, bringing bottom lip down to obtain good depth. Mom reports some improvement with discomfort. Mom wanted to try side lying position. Took baby off the breast and moved to side lying position on the bed. Demonstrated to FOB how to assist Mom with latching baby in this position. Baby latched easily and Mom reported less discomfort in this position. Baby demonstrated a good rhythmic suck with audible swallows. Care for sore nipples reviewed with Mom. Engorgement care reviewed and written plan given to Mom. Mom post pumped after BF and applied ice to left breast. Pump rental completed. OP follow up scheduled for Wednesday, June 3rd per Mom's request. Advised of support group. Mom is to BF with every feeding, pre-pump to soften aerola. Post pump to comfort, apply ice. Stressed importance of not missing any feedings. Signs/symptoms of infection reviewed with Mom. Call if she has questions or concerns before OP follow up.   Maternal Data    Feeding Feeding Type: Breast Fed Nipple Type: Slow - flow Length of feed: 25 min  LATCH Score/Interventions Latch: Grasps breast easily, tongue down, lips flanged, rhythmical sucking. Intervention(s): Adjust position;Assist with latch;Breast massage;Breast compression  Audible Swallowing: Spontaneous and intermittent  Type of Nipple: Flat (short nipple shaft) Intervention(s): Shells;Hand pump;Double electric pump  Comfort  (Breast/Nipple): Engorged, cracked, bleeding, large blisters, severe discomfort Problem noted: Engorgment;Cracked, bleeding, blisters, bruises Intervention(s): Ice;Hand expression;Reverse pressure Intervention(s): Expressed breast milk to nipple  Interventions (Mild/moderate discomfort): Comfort gels  Hold (Positioning): Assistance needed to correctly position infant at breast and maintain latch. Intervention(s): Breastfeeding basics reviewed;Support Pillows;Position options;Skin to skin  LATCH Score: 6  Lactation Tools Discussed/Used Tools: Shells;Pump;Comfort gels Shell Type: Inverted Breast pump type: Double-Electric Breast Pump   Consult Status Consult Status: Complete Date: 05/27/13 Follow-up type: In-patient    Alfred Levins 05/27/2013, 2:59 PM

## 2013-06-04 ENCOUNTER — Ambulatory Visit (HOSPITAL_COMMUNITY): Admission: RE | Admit: 2013-06-04 | Payer: BC Managed Care – PPO | Source: Ambulatory Visit

## 2013-11-03 ENCOUNTER — Encounter (HOSPITAL_COMMUNITY): Payer: Self-pay

## 2014-09-18 LAB — OB RESULTS CONSOLE RPR: RPR: NONREACTIVE

## 2014-09-18 LAB — OB RESULTS CONSOLE RUBELLA ANTIBODY, IGM: Rubella: IMMUNE

## 2014-09-18 LAB — OB RESULTS CONSOLE ABO/RH: RH Type: POSITIVE

## 2014-09-18 LAB — OB RESULTS CONSOLE ANTIBODY SCREEN: Antibody Screen: NEGATIVE

## 2014-09-18 LAB — OB RESULTS CONSOLE HEPATITIS B SURFACE ANTIGEN: HEP B S AG: NEGATIVE

## 2014-09-18 LAB — OB RESULTS CONSOLE HIV ANTIBODY (ROUTINE TESTING): HIV: NONREACTIVE

## 2015-01-03 NOTE — L&D Delivery Note (Signed)
Patient was C/C/+1 and pushed for approx 1hr 30 minutes with epidural.   NSVD  Female infant, direct OP, Apgars 9/9, weight pending.   The patient had 2nd laceration repaired with 2-0 vicryl Fundus was firm. EBL was expected amount. Placenta was delivered intact. Vagina was clear.  Baby was vigorous and doing skin to skin with mother.  Philip AspenALLAHAN, Taneka Espiritu

## 2015-02-22 LAB — OB RESULTS CONSOLE GC/CHLAMYDIA
CHLAMYDIA, DNA PROBE: NEGATIVE
Gonorrhea: NEGATIVE

## 2015-02-22 LAB — OB RESULTS CONSOLE GBS: GBS: NEGATIVE

## 2015-03-13 ENCOUNTER — Encounter (HOSPITAL_COMMUNITY): Payer: Self-pay

## 2015-03-13 ENCOUNTER — Inpatient Hospital Stay (HOSPITAL_COMMUNITY)
Admission: AD | Admit: 2015-03-13 | Discharge: 2015-03-15 | DRG: 775 | Disposition: A | Discharge status: Home / Self Care | Payer: BC Managed Care – PPO | Source: Ambulatory Visit | Attending: Obstetrics and Gynecology | Admitting: Obstetrics and Gynecology

## 2015-03-13 ENCOUNTER — Inpatient Hospital Stay (HOSPITAL_COMMUNITY)
Admission: AD | Admit: 2015-03-13 | Discharge: 2015-03-13 | Disposition: A | Discharge status: Home / Self Care | Payer: BC Managed Care – PPO | Source: Ambulatory Visit | Attending: Obstetrics and Gynecology | Admitting: Obstetrics and Gynecology

## 2015-03-13 DIAGNOSIS — D649 Anemia, unspecified: Secondary | ICD-10-CM | POA: Diagnosis present

## 2015-03-13 DIAGNOSIS — O99214 Obesity complicating childbirth: Secondary | ICD-10-CM | POA: Diagnosis present

## 2015-03-13 DIAGNOSIS — O4292 Full-term premature rupture of membranes, unspecified as to length of time between rupture and onset of labor: Principal | ICD-10-CM | POA: Diagnosis present

## 2015-03-13 DIAGNOSIS — E669 Obesity, unspecified: Secondary | ICD-10-CM | POA: Diagnosis present

## 2015-03-13 DIAGNOSIS — O42013 Preterm premature rupture of membranes, onset of labor within 24 hours of rupture, third trimester: Secondary | ICD-10-CM

## 2015-03-13 DIAGNOSIS — E559 Vitamin D deficiency, unspecified: Secondary | ICD-10-CM | POA: Diagnosis present

## 2015-03-13 DIAGNOSIS — Z6832 Body mass index (BMI) 32.0-32.9, adult: Secondary | ICD-10-CM

## 2015-03-13 DIAGNOSIS — O9902 Anemia complicating childbirth: Secondary | ICD-10-CM | POA: Diagnosis present

## 2015-03-13 DIAGNOSIS — Z3A38 38 weeks gestation of pregnancy: Secondary | ICD-10-CM

## 2015-03-13 DIAGNOSIS — O4290 Premature rupture of membranes, unspecified as to length of time between rupture and onset of labor, unspecified weeks of gestation: Secondary | ICD-10-CM | POA: Diagnosis present

## 2015-03-13 LAB — POCT FERN TEST: POCT Fern Test: NEGATIVE

## 2015-03-13 LAB — AMNISURE RUPTURE OF MEMBRANE (ROM) NOT AT ARMC: Amnisure ROM: NEGATIVE

## 2015-03-13 NOTE — Discharge Instructions (Signed)
Third Trimester of Pregnancy °The third trimester is from week 29 through week 42, months 7 through 9. The third trimester is a time when the fetus is growing rapidly. At the end of the ninth month, the fetus is about 20 inches in length and weighs 6-10 pounds.  °BODY CHANGES °Your body goes through many changes during pregnancy. The changes vary from woman to woman.  °· Your weight will continue to increase. You can expect to gain 25-35 pounds (11-16 kg) by the end of the pregnancy. °· You may begin to get stretch marks on your hips, abdomen, and breasts. °· You may urinate more often because the fetus is moving lower into your pelvis and pressing on your bladder. °· You may develop or continue to have heartburn as a result of your pregnancy. °· You may develop constipation because certain hormones are causing the muscles that push waste through your intestines to slow down. °· You may develop hemorrhoids or swollen, bulging veins (varicose veins). °· You may have pelvic pain because of the weight gain and pregnancy hormones relaxing your joints between the bones in your pelvis. Backaches may result from overexertion of the muscles supporting your posture. °· You may have changes in your hair. These can include thickening of your hair, rapid growth, and changes in texture. Some women also have hair loss during or after pregnancy, or hair that feels dry or thin. Your hair will most likely return to normal after your baby is born. °· Your breasts will continue to grow and be tender. A yellow discharge may leak from your breasts called colostrum. °· Your belly button may stick out. °· You may feel short of breath because of your expanding uterus. °· You may notice the fetus "dropping," or moving lower in your abdomen. °· You may have a bloody mucus discharge. This usually occurs a few days to a week before labor begins. °· Your cervix becomes thin and soft (effaced) near your due date. °WHAT TO EXPECT AT YOUR PRENATAL  EXAMS  °You will have prenatal exams every 2 weeks until week 36. Then, you will have weekly prenatal exams. During a routine prenatal visit: °· You will be weighed to make sure you and the fetus are growing normally. °· Your blood pressure is taken. °· Your abdomen will be measured to track your baby's growth. °· The fetal heartbeat will be listened to. °· Any test results from the previous visit will be discussed. °· You may have a cervical check near your due date to see if you have effaced. °At around 36 weeks, your caregiver will check your cervix. At the same time, your caregiver will also perform a test on the secretions of the vaginal tissue. This test is to determine if a type of bacteria, Group B streptococcus, is present. Your caregiver will explain this further. °Your caregiver may ask you: °· What your birth plan is. °· How you are feeling. °· If you are feeling the baby move. °· If you have had any abnormal symptoms, such as leaking fluid, bleeding, severe headaches, or abdominal cramping. °· If you are using any tobacco products, including cigarettes, chewing tobacco, and electronic cigarettes. °· If you have any questions. °Other tests or screenings that may be performed during your third trimester include: °· Blood tests that check for low iron levels (anemia). °· Fetal testing to check the health, activity level, and growth of the fetus. Testing is done if you have certain medical conditions or if   there are problems during the pregnancy. °· HIV (human immunodeficiency virus) testing. If you are at high risk, you may be screened for HIV during your third trimester of pregnancy. °FALSE LABOR °You may feel small, irregular contractions that eventually go away. These are called Braxton Hicks contractions, or false labor. Contractions may last for hours, days, or even weeks before true labor sets in. If contractions come at regular intervals, intensify, or become painful, it is best to be seen by your  caregiver.  °SIGNS OF LABOR  °· Menstrual-like cramps. °· Contractions that are 5 minutes apart or less. °· Contractions that start on the top of the uterus and spread down to the lower abdomen and back. °· A sense of increased pelvic pressure or back pain. °· A watery or bloody mucus discharge that comes from the vagina. °If you have any of these signs before the 37th week of pregnancy, call your caregiver right away. You need to go to the hospital to get checked immediately. °HOME CARE INSTRUCTIONS  °· Avoid all smoking, herbs, alcohol, and unprescribed drugs. These chemicals affect the formation and growth of the baby. °· Do not use any tobacco products, including cigarettes, chewing tobacco, and electronic cigarettes. If you need help quitting, ask your health care provider. You may receive counseling support and other resources to help you quit. °· Follow your caregiver's instructions regarding medicine use. There are medicines that are either safe or unsafe to take during pregnancy. °· Exercise only as directed by your caregiver. Experiencing uterine cramps is a good sign to stop exercising. °· Continue to eat regular, healthy meals. °· Wear a good support bra for breast tenderness. °· Do not use hot tubs, steam rooms, or saunas. °· Wear your seat belt at all times when driving. °· Avoid raw meat, uncooked cheese, cat litter boxes, and soil used by cats. These carry germs that can cause birth defects in the baby. °· Take your prenatal vitamins. °· Take 1500-2000 mg of calcium daily starting at the 20th week of pregnancy until you deliver your baby. °· Try taking a stool softener (if your caregiver approves) if you develop constipation. Eat more high-fiber foods, such as fresh vegetables or fruit and whole grains. Drink plenty of fluids to keep your urine clear or pale yellow. °· Take warm sitz baths to soothe any pain or discomfort caused by hemorrhoids. Use hemorrhoid cream if your caregiver approves. °· If  you develop varicose veins, wear support hose. Elevate your feet for 15 minutes, 3-4 times a day. Limit salt in your diet. °· Avoid heavy lifting, wear low heal shoes, and practice good posture. °· Rest a lot with your legs elevated if you have leg cramps or low back pain. °· Visit your dentist if you have not gone during your pregnancy. Use a soft toothbrush to brush your teeth and be gentle when you floss. °· A sexual relationship may be continued unless your caregiver directs you otherwise. °· Do not travel far distances unless it is absolutely necessary and only with the approval of your caregiver. °· Take prenatal classes to understand, practice, and ask questions about the labor and delivery. °· Make a trial run to the hospital. °· Pack your hospital bag. °· Prepare the baby's nursery. °· Continue to go to all your prenatal visits as directed by your caregiver. °SEEK MEDICAL CARE IF: °· You are unsure if you are in labor or if your water has broken. °· You have dizziness. °· You have   mild pelvic cramps, pelvic pressure, or nagging pain in your abdominal area. °· You have persistent nausea, vomiting, or diarrhea. °· You have a bad smelling vaginal discharge. °· You have pain with urination. °SEEK IMMEDIATE MEDICAL CARE IF:  °· You have a fever. °· You are leaking fluid from your vagina. °· You have spotting or bleeding from your vagina. °· You have severe abdominal cramping or pain. °· You have rapid weight loss or gain. °· You have shortness of breath with chest pain. °· You notice sudden or extreme swelling of your face, hands, ankles, feet, or legs. °· You have not felt your baby move in over an hour. °· You have severe headaches that do not go away with medicine. °· You have vision changes. °  °This information is not intended to replace advice given to you by your health care provider. Make sure you discuss any questions you have with your health care provider. °  °Document Released: 12/13/2000 Document  Revised: 01/09/2014 Document Reviewed: 02/20/2012 °Elsevier Interactive Patient Education ©2016 [employer] °Fetal Movement Counts °Patient Name: __________________________________________________ Patient Due Date: ____________________ °Performing a fetal movement count is highly recommended in high-risk pregnancies, but it is good for every pregnant woman to do. Your health care provider may ask you to start counting fetal movements at 28 weeks of the pregnancy. Fetal movements often increase: °After eating a full meal. °After physical activity. °After eating or drinking something sweet or cold. °At rest. °Pay attention to when you feel the baby is most active. This will help you notice a pattern of your baby's sleep and wake cycles and what factors contribute to an increase in fetal movement. It is important to perform a fetal movement count at the same time each day when your baby is normally most active.  °HOW TO COUNT FETAL MOVEMENTS °Find a quiet and comfortable area to sit or lie down on your left side. Lying on your left side provides the best blood and oxygen circulation to your baby. °Write down the day and time on a sheet of paper or in a journal. °Start counting kicks, flutters, swishes, rolls, or jabs in a 2-hour period. You should feel at least 10 movements within 2 hours. °If you do not feel 10 movements in 2 hours, wait 2-3 hours and count again. Look for a change in the pattern or not enough counts in 2 hours. °SEEK MEDICAL CARE IF: °You feel less than 10 counts in 2 hours, tried twice. °There is no movement in over an hour. °The pattern is changing or taking longer each day to reach 10 counts in 2 hours. °You feel the baby is not moving as he or she usually does. °Date: ____________ Movements: ____________ Start time: ____________ Finish time: ____________  °Date: ____________ Movements: ____________ Start time: ____________ Finish time: ____________ °Date: ____________ Movements: ____________  Start time: ____________ Finish time: ____________ °Date: ____________ Movements: ____________ Start time: ____________ Finish time: ____________ °Date: ____________ Movements: ____________ Start time: ____________ Finish time: ____________ °Date: ____________ Movements: ____________ Start time: ____________ Finish time: ____________ °Date: ____________ Movements: ____________ Start time: ____________ Finish time: ____________ °Date: ____________ Movements: ____________ Start time: ____________ Finish time: ____________  °Date: ____________ Movements: ____________ Start time: ____________ Finish time: ____________ °Date: ____________ Movements: ____________ Start time: ____________ Finish time: ____________ °Date: ____________ Movements: ____________ Start time: ____________ Finish time: ____________ °Date: ____________ Movements: ____________ Start time: ____________ Finish time: ____________ °Date: ____________ Movements: ____________ Start time: ____________ Finish time: ____________ °Date: ____________ Movements:   ____________ Start time: ____________ Finish time: ____________ °Date: ____________ Movements: ____________ Start time: ____________ Finish time: ____________  °Date: ____________ Movements: ____________ Start time: ____________ Finish time: ____________ °Date: ____________ Movements: ____________ Start time: ____________ Finish time: ____________ °Date: ____________ Movements: ____________ Start time: ____________ Finish time: ____________ °Date: ____________ Movements: ____________ Start time: ____________ Finish time: ____________ °Date: ____________ Movements: ____________ Start time: ____________ Finish time: ____________ °Date: ____________ Movements: ____________ Start time: ____________ Finish time: ____________ °Date: ____________ Movements: ____________ Start time: ____________ Finish time: ____________  °Date: ____________ Movements: ____________ Start time: ____________ Finish time:  ____________ °Date: ____________ Movements: ____________ Start time: ____________ Finish time: ____________ °Date: ____________ Movements: ____________ Start time: ____________ Finish time: ____________ °Date: ____________ Movements: ____________ Start time: ____________ Finish time: ____________ °Date: ____________ Movements: ____________ Start time: ____________ Finish time: ____________ °Date: ____________ Movements: ____________ Start time: ____________ Finish time: ____________ °Date: ____________ Movements: ____________ Start time: ____________ Finish time: ____________  °Date: ____________ Movements: ____________ Start time: ____________ Finish time: ____________ °Date: ____________ Movements: ____________ Start time: ____________ Finish time: ____________ °Date: ____________ Movements: ____________ Start time: ____________ Finish time: ____________ °Date: ____________ Movements: ____________ Start time: ____________ Finish time: ____________ °Date: ____________ Movements: ____________ Start time: ____________ Finish time: ____________ °Date: ____________ Movements: ____________ Start time: ____________ Finish time: ____________ °Date: ____________ Movements: ____________ Start time: ____________ Finish time: ____________  °Date: ____________ Movements: ____________ Start time: ____________ Finish time: ____________ °Date: ____________ Movements: ____________ Start time: ____________ Finish time: ____________ °Date: ____________ Movements: ____________ Start time: ____________ Finish time: ____________ °Date: ____________ Movements: ____________ Start time: ____________ Finish time: ____________ °Date: ____________ Movements: ____________ Start time: ____________ Finish time: ____________ °Date: ____________ Movements: ____________ Start time: ____________ Finish time: ____________ °Date: ____________ Movements: ____________ Start time: ____________ Finish time: ____________  °Date: ____________ Movements:  ____________ Start time: ____________ Finish time: ____________ °Date: ____________ Movements: ____________ Start time: ____________ Finish time: ____________ °Date: ____________ Movements: ____________ Start time: ____________ Finish time: ____________ °Date: ____________ Movements: ____________ Start time: ____________ Finish time: ____________ °Date: ____________ Movements: ____________ Start time: ____________ Finish time: ____________ °Date: ____________ Movements: ____________ Start time: ____________ Finish time: ____________ °Date: ____________ Movements: ____________ Start time: ____________ Finish time: ____________  °Date: ____________ Movements: ____________ Start time: ____________ Finish time: ____________ °Date: ____________ Movements: ____________ Start time: ____________ Finish time: ____________ °Date: ____________ Movements: ____________ Start time: ____________ Finish time: ____________ °Date: ____________ Movements: ____________ Start time: ____________ Finish time: ____________ °Date: ____________ Movements: ____________ Start time: ____________ Finish time: ____________ °Date: ____________ Movements: ____________ Start time: ____________ Finish time: ____________ °  °This information is not intended to replace advice given to you by your health care provider. Make sure you discuss any questions you have with your health care provider. °  °Document Released: 01/18/2006 Document Revised: 01/09/2014 Document Reviewed: 10/16/2011 °Elsevier Interactive Patient Education ©2016 [employer] ° °

## 2015-03-13 NOTE — MAU Note (Signed)
At 0345 ? Water broke, clear fluid.  Baby moving well. Cramping . No bleeding.

## 2015-03-13 NOTE — Progress Notes (Signed)
Dr Claiborne Billingsallahan notified of pt's complaints of ROM, FERN neg and amnisure NEGative. Orders received to discharge home

## 2015-03-14 ENCOUNTER — Inpatient Hospital Stay (HOSPITAL_COMMUNITY): Payer: BC Managed Care – PPO | Admitting: Anesthesiology

## 2015-03-14 ENCOUNTER — Encounter (HOSPITAL_COMMUNITY): Payer: Self-pay

## 2015-03-14 DIAGNOSIS — O4290 Premature rupture of membranes, unspecified as to length of time between rupture and onset of labor, unspecified weeks of gestation: Secondary | ICD-10-CM | POA: Diagnosis present

## 2015-03-14 DIAGNOSIS — Z3A38 38 weeks gestation of pregnancy: Secondary | ICD-10-CM | POA: Diagnosis not present

## 2015-03-14 DIAGNOSIS — Z6832 Body mass index (BMI) 32.0-32.9, adult: Secondary | ICD-10-CM | POA: Diagnosis not present

## 2015-03-14 DIAGNOSIS — E559 Vitamin D deficiency, unspecified: Secondary | ICD-10-CM | POA: Diagnosis present

## 2015-03-14 DIAGNOSIS — D649 Anemia, unspecified: Secondary | ICD-10-CM | POA: Diagnosis present

## 2015-03-14 DIAGNOSIS — E669 Obesity, unspecified: Secondary | ICD-10-CM | POA: Diagnosis present

## 2015-03-14 DIAGNOSIS — O9902 Anemia complicating childbirth: Secondary | ICD-10-CM | POA: Diagnosis present

## 2015-03-14 DIAGNOSIS — O99214 Obesity complicating childbirth: Secondary | ICD-10-CM | POA: Diagnosis present

## 2015-03-14 DIAGNOSIS — O4292 Full-term premature rupture of membranes, unspecified as to length of time between rupture and onset of labor: Secondary | ICD-10-CM | POA: Diagnosis present

## 2015-03-14 LAB — POCT FERN TEST: POCT Fern Test: POSITIVE

## 2015-03-14 LAB — TYPE AND SCREEN
ABO/RH(D): B POS
ANTIBODY SCREEN: NEGATIVE

## 2015-03-14 LAB — CBC
HEMATOCRIT: 31.5 % — AB (ref 36.0–46.0)
HEMOGLOBIN: 9.9 g/dL — AB (ref 12.0–15.0)
MCH: 20.8 pg — AB (ref 26.0–34.0)
MCHC: 31.4 g/dL (ref 30.0–36.0)
MCV: 66 fL — AB (ref 78.0–100.0)
Platelets: 231 10*3/uL (ref 150–400)
RBC: 4.77 MIL/uL (ref 3.87–5.11)
RDW: 16.3 % — ABNORMAL HIGH (ref 11.5–15.5)
WBC: 12.2 10*3/uL — ABNORMAL HIGH (ref 4.0–10.5)

## 2015-03-14 LAB — RPR: RPR Ser Ql: NONREACTIVE

## 2015-03-14 MED ORDER — OXYCODONE-ACETAMINOPHEN 5-325 MG PO TABS: 1.0000 | ORAL_TABLET | ORAL | Status: DC | PRN

## 2015-03-14 MED ORDER — OXYTOCIN BOLUS FROM INFUSION
500.0000 mL | INTRAVENOUS | Status: DC
  Administered 2015-03-14: 500 mL via INTRAVENOUS

## 2015-03-14 MED ORDER — DIPHENHYDRAMINE HCL 50 MG/ML IJ SOLN: 12.5000 mg | INTRAMUSCULAR | Status: DC | PRN

## 2015-03-14 MED ORDER — PHENYLEPHRINE 40 MCG/ML (10ML) SYRINGE FOR IV PUSH (FOR BLOOD PRESSURE SUPPORT)
80.0000 ug | PREFILLED_SYRINGE | INTRAVENOUS | Status: DC | PRN
  Filled 2015-03-14: qty 2
  Filled 2015-03-14: qty 20

## 2015-03-14 MED ORDER — PHENYLEPHRINE 40 MCG/ML (10ML) SYRINGE FOR IV PUSH (FOR BLOOD PRESSURE SUPPORT)
80.0000 ug | PREFILLED_SYRINGE | INTRAVENOUS | Status: DC | PRN
  Filled 2015-03-14: qty 2

## 2015-03-14 MED ORDER — EPHEDRINE 5 MG/ML INJ
10.0000 mg | INTRAVENOUS | Status: DC | PRN
  Filled 2015-03-14: qty 2

## 2015-03-14 MED ORDER — OXYTOCIN 10 UNIT/ML IJ SOLN
1.0000 m[IU]/min | INTRAVENOUS | Status: DC
  Administered 2015-03-14: 2 m[IU]/min via INTRAVENOUS
  Filled 2015-03-14: qty 10

## 2015-03-14 MED ORDER — LIDOCAINE HCL (PF) 1 % IJ SOLN
INTRAMUSCULAR | Status: DC | PRN
  Administered 2015-03-14: 5 mL via EPIDURAL
  Administered 2015-03-14: 3 mL via EPIDURAL
  Administered 2015-03-14: 2 mL via EPIDURAL

## 2015-03-14 MED ORDER — TERBUTALINE SULFATE 1 MG/ML IJ SOLN
0.2500 mg | Freq: Once | INTRAMUSCULAR | Status: DC | PRN
  Filled 2015-03-14: qty 1

## 2015-03-14 MED ORDER — ONDANSETRON HCL 4 MG PO TABS: 4.0000 mg | ORAL_TABLET | ORAL | Status: DC | PRN

## 2015-03-14 MED ORDER — OXYCODONE-ACETAMINOPHEN 5-325 MG PO TABS: 2.0000 | ORAL_TABLET | ORAL | Status: DC | PRN

## 2015-03-14 MED ORDER — FENTANYL 2.5 MCG/ML BUPIVACAINE 1/10 % EPIDURAL INFUSION (WH - ANES)
14.0000 mL/h | INTRAMUSCULAR | Status: DC | PRN
  Administered 2015-03-14: 14 mL/h via EPIDURAL
  Filled 2015-03-14: qty 125

## 2015-03-14 MED ORDER — OXYTOCIN 10 UNIT/ML IJ SOLN: 2.5000 [IU]/h | INTRAMUSCULAR | Status: DC

## 2015-03-14 MED ORDER — SENNOSIDES-DOCUSATE SODIUM 8.6-50 MG PO TABS
2.0000 | ORAL_TABLET | ORAL | Status: DC
  Administered 2015-03-14: 2 via ORAL
  Filled 2015-03-14: qty 2

## 2015-03-14 MED ORDER — ONDANSETRON HCL 4 MG/2ML IJ SOLN: 4.0000 mg | INTRAMUSCULAR | Status: DC | PRN

## 2015-03-14 MED ORDER — DIPHENHYDRAMINE HCL 25 MG PO CAPS: 25.0000 mg | ORAL_CAPSULE | Freq: Four times a day (QID) | ORAL | Status: DC | PRN

## 2015-03-14 MED ORDER — PRENATAL MULTIVITAMIN CH
1.0000 | ORAL_TABLET | Freq: Every day | ORAL | Status: DC
  Administered 2015-03-15: 1 via ORAL
  Filled 2015-03-14: qty 1

## 2015-03-14 MED ORDER — LACTATED RINGERS IV SOLN
INTRAVENOUS | Status: DC
  Administered 2015-03-14: 02:00:00 via INTRAVENOUS

## 2015-03-14 MED ORDER — ZOLPIDEM TARTRATE 5 MG PO TABS: 5.0000 mg | ORAL_TABLET | Freq: Every evening | ORAL | Status: DC | PRN

## 2015-03-14 MED ORDER — TETANUS-DIPHTH-ACELL PERTUSSIS 5-2.5-18.5 LF-MCG/0.5 IM SUSP
0.5000 mL | Freq: Once | INTRAMUSCULAR | Status: AC
  Administered 2015-03-15: 0.5 mL via INTRAMUSCULAR

## 2015-03-14 MED ORDER — LACTATED RINGERS IV SOLN: 500.0000 mL | Freq: Once | INTRAVENOUS | Status: DC

## 2015-03-14 MED ORDER — ACETAMINOPHEN 325 MG PO TABS: 650.0000 mg | ORAL_TABLET | ORAL | Status: DC | PRN

## 2015-03-14 MED ORDER — IBUPROFEN 600 MG PO TABS
600.0000 mg | ORAL_TABLET | Freq: Four times a day (QID) | ORAL | Status: DC
  Administered 2015-03-14 – 2015-03-15 (×4): 600 mg via ORAL
  Filled 2015-03-14 (×4): qty 1

## 2015-03-14 MED ORDER — CITRIC ACID-SODIUM CITRATE 334-500 MG/5ML PO SOLN: 30.0000 mL | ORAL | Status: DC | PRN

## 2015-03-14 MED ORDER — LANOLIN HYDROUS EX OINT: TOPICAL_OINTMENT | CUTANEOUS | Status: DC | PRN

## 2015-03-14 MED ORDER — SIMETHICONE 80 MG PO CHEW: 80.0000 mg | CHEWABLE_TABLET | ORAL | Status: DC | PRN

## 2015-03-14 MED ORDER — ONDANSETRON HCL 4 MG/2ML IJ SOLN: 4.0000 mg | Freq: Four times a day (QID) | INTRAMUSCULAR | Status: DC | PRN

## 2015-03-14 MED ORDER — FLEET ENEMA 7-19 GM/118ML RE ENEM: 1.0000 | ENEMA | RECTAL | Status: DC | PRN

## 2015-03-14 MED ORDER — LIDOCAINE HCL (PF) 1 % IJ SOLN
30.0000 mL | INTRAMUSCULAR | Status: DC | PRN
  Administered 2015-03-14: 30 mL via SUBCUTANEOUS
  Filled 2015-03-14: qty 30

## 2015-03-14 MED ORDER — WITCH HAZEL-GLYCERIN EX PADS: 1.0000 "application " | MEDICATED_PAD | CUTANEOUS | Status: DC | PRN

## 2015-03-14 MED ORDER — LACTATED RINGERS IV SOLN: 500.0000 mL | INTRAVENOUS | Status: DC | PRN

## 2015-03-14 MED ORDER — BENZOCAINE-MENTHOL 20-0.5 % EX AERO
1.0000 "application " | INHALATION_SPRAY | CUTANEOUS | Status: DC | PRN
  Administered 2015-03-14: 1 via TOPICAL
  Filled 2015-03-14: qty 56

## 2015-03-14 MED ORDER — DIBUCAINE 1 % RE OINT
1.0000 "application " | TOPICAL_OINTMENT | RECTAL | Status: DC | PRN
  Administered 2015-03-14: 1 via RECTAL
  Filled 2015-03-14: qty 28

## 2015-03-14 NOTE — Progress Notes (Signed)
Notified of arrival in MAU and positive fern. Will admit to labor and delivery

## 2015-03-14 NOTE — Progress Notes (Signed)
[facility] called for verification of prenatal care provider. Pt is [facility]. Will notify Dr. Claiborne Billingsallahan

## 2015-03-14 NOTE — Anesthesia Procedure Notes (Signed)

## 2015-03-14 NOTE — Progress Notes (Signed)
Notified of pt arrival in MAU and complaint with positive fern and cervical exam. Will admit to labor and delivery. Recheck in 2 hours and if unchanged, start pitocin 2x2

## 2015-03-14 NOTE — Progress Notes (Signed)
Daylight savings time; therefore no data from 0200-0300.

## 2015-03-14 NOTE — MAU Note (Signed)
Was here early today with ? ROM. Amnisure and fern negative then. Sent home. States since 2130 has changed several pads with clear fld. Some contractions

## 2015-03-14 NOTE — Lactation Note (Signed)
This note was copied from a baby's chart. Lactation Consultation Note  Patient Name: Deborah Bennett ZOXWR'UToday's Date: 03/14/2015 Reason for consult: Initial assessment Baby at 6 hr of life. Experienced bf mom reports 2 good bf and 1 attempt. Denies breast or nipple pain. Answered questions about voids. She stated that she can manually express and has a spoon in the room. Discussed baby behavior, feeding frequency, pumping,baby belly size, wt loss, breast changes, and nipple care. She bf her older child for 13 months. She had engorgement issues throughout bf. Discussed supply management. Given lactation handouts. Aware of OP services and support group.     Maternal Data Has patient been taught Hand Expression?: Yes Does the patient have breastfeeding experience prior to this delivery?: Yes  Feeding Feeding Type: Breast Fed Length of feed: 22 min  LATCH Score/Interventions Latch: Repeated attempts needed to sustain latch, nipple held in mouth throughout feeding, stimulation needed to elicit sucking reflex. Intervention(s): Breast massage;Breast compression  Audible Swallowing: None Intervention(s): Skin to skin;Hand expression  Type of Nipple: Everted at rest and after stimulation  Comfort (Breast/Nipple): Soft / non-tender     Hold (Positioning): No assistance needed to correctly position infant at breast.  LATCH Score: 7  Lactation Tools Discussed/Used WIC Program: No   Consult Status Consult Status: Follow-up Date: 03/15/15 Follow-up type: In-patient    [name] 03/14/2015, 7:23 PM

## 2015-03-14 NOTE — H&P (Signed)
30 y.o. 5445w0d  G2P1001 comes in c/o SROM.  Otherwise has good fetal movement and no bleeding.  Past Medical History  Diagnosis Date  . Anemia   . Vitamin D deficiency disease   . Headache(784.0)     otc med prn    Past Surgical History  Procedure Laterality Date  . Hysteroscopy w/d&c N/A 07/12/2012    Procedure: DILATATION AND CURETTAGE /HYSTEROSCOPY/CHROMTUBATION FULGERATION OF ENDOMETRIOSIS;  Surgeon: Freddrick MarchKendra H. Tenny Crawoss, MD;  Location: [facility];  Service: Gynecology;  Laterality: N/A;  . Laparoscopy Left 07/12/2012    Procedure: LEFT OVARIAN CYSTECTOM WITH CHROMOPERTUBATION;  Surgeon: Freddrick MarchKendra H. Tenny Crawoss, MD;  Location: [facility];  Service: Gynecology;  Laterality: Left;  WITH LEFT OVARIAN CYSTECTOMY  . No past surgeries    . Other surgical history Left 07/12/2012    cyst removed from ovary    OB History  Gravida Para Term Preterm AB SAB TAB Ectopic Multiple Living  2 1 1       1     # Outcome Date GA Lbr Len/2nd Weight Sex Delivery Anes PTL Lv  2 Current           1 Term 05/23/13 4071w5d 09:37 / 01:48 3.725 kg (8 lb 3.4 oz) F Vag-Vacuum EPI  Y      Social History   Social History  . Marital Status: Married    Spouse Name: N/A  . Number of Children: N/A  . Years of Education: N/A   Occupational History  . Not on file.   Social History Main Topics  . Smoking status: Never Smoker   . Smokeless tobacco: Never Used  . Alcohol Use: No  . Drug Use: No  . Sexual Activity: Yes    Birth Control/ Protection: None   Other Topics Concern  . Not on file   Social History Narrative   Review of patient's allergies indicates no known allergies.    Prenatal Transfer Tool  Maternal Diabetes: No Genetic Screening: Normal Maternal Ultrasounds/Referrals: Normal Fetal Ultrasounds or other Referrals:  None Maternal Substance Abuse:  No Significant Maternal Medications:  None Significant Maternal Lab Results: Lab values include: Group B Strep negative  Other PNC: uncomplicated.    Filed  Vitals:   03/14/15 0931 03/14/15 1001  BP: 133/83 116/67  Pulse: 96 78  Temp:  98.1 F (36.7 C)  Resp:  18     Lungs/Cor:  NAD Abdomen:  soft, gravid Ex:  no cords, erythema SVE:  2/50/-3 at admission, now anterior lip FHTs:  120, good STV, NST R Toco:  q 2-4   A/P   Admitted with SROM  Routine ordered, pitocin started for augmentation  Epidural placed  GBS neg  Konya Fauble

## 2015-03-14 NOTE — Anesthesia Preprocedure Evaluation (Signed)
Anesthesia Evaluation  Patient identified by MRN, date of birth, ID band Patient awake    Reviewed: Allergy & Precautions, NPO status , Patient's Chart, lab work & pertinent test results  Airway Mallampati: II  TM Distance: >3 FB Neck ROM: Full    Dental  (+) Teeth Intact, Dental Advisory Given   Pulmonary neg pulmonary ROS,    Pulmonary exam normal breath sounds clear to auscultation       Cardiovascular negative cardio ROS Normal cardiovascular exam Rhythm:Regular Rate:Normal     Neuro/Psych  Headaches, negative psych ROS   GI/Hepatic negative GI ROS, Neg liver ROS,   Endo/Other  Obesity   Renal/GU negative Renal ROS     Musculoskeletal negative musculoskeletal ROS (+)   Abdominal   Peds  Hematology  (+) Blood dyscrasia, anemia , Plt 231k   Anesthesia Other Findings Day of surgery medications reviewed with the patient.  Reproductive/Obstetrics (+) Pregnancy                             Anesthesia Physical Anesthesia Plan  ASA: II  Anesthesia Plan: Epidural   Post-op Pain Management:    Induction:   Airway Management Planned:   Additional Equipment:   Intra-op Plan:   Post-operative Plan:   Informed Consent: I have reviewed the patients History and Physical, chart, labs and discussed the procedure including the risks, benefits and alternatives for the proposed anesthesia with the patient or authorized representative who has indicated his/her understanding and acceptance.   Dental advisory given  Plan Discussed with:   Anesthesia Plan Comments: (Patient identified. Risks/Benefits/Options discussed with patient including but not limited to bleeding, infection, nerve damage, paralysis, failed block, incomplete pain control, headache, blood pressure changes, nausea, vomiting, reactions to medication both or allergic, itching and postpartum back pain. Confirmed with bedside  nurse the patient's most recent platelet count. Confirmed with patient that they are not currently taking any anticoagulation, have any bleeding history or any family history of bleeding disorders. Patient expressed understanding and wished to proceed. All questions were answered. )        Anesthesia Quick Evaluation

## 2015-03-15 LAB — CBC
HCT: 26.2 % — ABNORMAL LOW (ref 36.0–46.0)
HEMOGLOBIN: 8.2 g/dL — AB (ref 12.0–15.0)
MCH: 20.8 pg — ABNORMAL LOW (ref 26.0–34.0)
MCHC: 31.3 g/dL (ref 30.0–36.0)
MCV: 66.3 fL — ABNORMAL LOW (ref 78.0–100.0)
PLATELETS: 181 10*3/uL (ref 150–400)
RBC: 3.95 MIL/uL (ref 3.87–5.11)
RDW: 16.4 % — AB (ref 11.5–15.5)
WBC: 14.3 10*3/uL — ABNORMAL HIGH (ref 4.0–10.5)

## 2015-03-15 MED ORDER — IBUPROFEN 600 MG PO TABS: 600.0000 mg | ORAL_TABLET | Freq: Four times a day (QID) | ORAL | Status: DC | PRN

## 2015-03-15 MED ORDER — OXYCODONE-ACETAMINOPHEN 5-325 MG PO TABS: 2.0000 | ORAL_TABLET | ORAL | Status: DC | PRN

## 2015-03-15 MED ORDER — DOCUSATE SODIUM 100 MG PO CAPS: 100.0000 mg | ORAL_CAPSULE | Freq: Two times a day (BID) | ORAL | Status: DC

## 2015-03-15 NOTE — Lactation Note (Signed)
This note was copied from a baby's chart. Lactation Consultation Note: When I arrived in room , Mother had independently latched infant onto the (L) breast. Observed good rhythmic pattern of suckling and swallows. Mother states that infant feeds more on the (L) breast and is slightly sore on that side. Advised mother to rotate positions. Reviewed treatment prevention for engorgement. Mother is aware of available LC services. Advised mother in cue base and cluster feeding.   Patient Name: Deborah Bennett Reason for consult: Follow-up assessment   Maternal Data    Feeding Feeding Type: Breast Fed  LATCH Score/Interventions Latch: Grasps breast easily, tongue down, lips flanged, rhythmical sucking.  Audible Swallowing: Spontaneous and intermittent Intervention(s): Skin to skin  Type of Nipple: Everted at rest and after stimulation  Comfort (Breast/Nipple): Soft / non-tender     Hold (Positioning): No assistance needed to correctly position infant at breast.  LATCH Score: 10  Lactation Tools Discussed/Used     Consult Status Consult Status: Complete    Michel BickersKendrick, Vergil Burby McCoy Bennett, 10:09 AM

## 2015-03-15 NOTE — Discharge Summary (Signed)
Obstetric Discharge Summary Reason for Admission: rupture of membranes Prenatal Procedures: ultrasound Intrapartum Procedures: spontaneous vaginal delivery Postpartum Procedures: none Complications-Operative and Postpartum: none HEMOGLOBIN  Date Value Ref Range Status  03/15/2015 8.2* 12.0 - 15.0 g/dL Final   HCT  Date Value Ref Range Status  03/15/2015 26.2* 36.0 - 46.0 % Final    Physical Exam:  General: alert, cooperative and appears stated age 53Lochia: appropriate Uterine Fundus: firm  Discharge Diagnoses: Term Pregnancy-delivered  Discharge Information: Date: 03/15/2015 Activity: pelvic rest Diet: routine Medications: Ibuprofen, Colace and Percocet Condition: improved Instructions: refer to practice specific booklet Discharge to: home Follow-up Information    Follow up with CALLAHAN, SIDNEY, DO In 4 weeks.   Specialty:  Obstetrics and Gynecology   Why:  For a postpartum visit   Contact information:   [address] [place] [address] [phone]       Newborn Data: Live born female  Birth Weight: 8 lb 2.5 oz (3700 g) APGAR: 9, 9  Home with mother.  Deborah Bennett. 03/15/2015, 9:09 AM

## 2015-03-15 NOTE — Anesthesia Postprocedure Evaluation (Signed)
Anesthesia Post Note  Patient: Deborah Bennett  Procedure(s) Performed: * No procedures listed *  Patient location during evaluation: Mother Baby Anesthesia Type: Epidural Level of consciousness: awake and alert Pain management: pain level controlled Vital Signs Assessment: post-procedure vital signs reviewed and stable Respiratory status: spontaneous breathing, nonlabored ventilation and respiratory function stable Cardiovascular status: stable Postop Assessment: no headache, no backache and epidural receding Anesthetic complications: no    Last Vitals:  Filed Vitals:   03/14/15 2036 03/15/15 0554  BP: 119/70 103/56  Pulse: 94 76  Temp: 37.3 C 37 C  Resp: 18 18    Last Pain:  Filed Vitals:   03/15/15 0555  PainSc: 2                  Sydnei Ohaver

## 2016-06-01 ENCOUNTER — Other Ambulatory Visit: Payer: Self-pay | Admitting: Obstetrics and Gynecology

## 2016-06-01 DIAGNOSIS — R2230 Localized swelling, mass and lump, unspecified upper limb: Secondary | ICD-10-CM

## 2016-06-19 ENCOUNTER — Other Ambulatory Visit: Payer: BC Managed Care – PPO

## 2016-06-23 ENCOUNTER — Ambulatory Visit
Admission: RE | Admit: 2016-06-23 | Discharge: 2016-06-23 | Disposition: A | Discharge status: Home / Self Care | Payer: Federal, State, Local not specified - PPO | Source: Ambulatory Visit | Attending: Obstetrics and Gynecology | Admitting: Obstetrics and Gynecology

## 2016-06-23 DIAGNOSIS — R2230 Localized swelling, mass and lump, unspecified upper limb: Secondary | ICD-10-CM

## 2018-10-16 ENCOUNTER — Other Ambulatory Visit: Payer: Self-pay

## 2018-10-16 DIAGNOSIS — Z20822 Contact with and (suspected) exposure to covid-19: Secondary | ICD-10-CM

## 2018-10-17 LAB — NOVEL CORONAVIRUS, NAA: SARS-CoV-2, NAA: NOT DETECTED

## 2020-05-17 ENCOUNTER — Encounter: Payer: Self-pay | Admitting: Emergency Medicine

## 2020-05-17 ENCOUNTER — Ambulatory Visit
Admission: EM | Admit: 2020-05-17 | Discharge: 2020-05-17 | Disposition: A | Discharge status: Home / Self Care | Payer: Federal, State, Local not specified - PPO | Attending: Internal Medicine | Admitting: Internal Medicine

## 2020-05-17 ENCOUNTER — Other Ambulatory Visit: Payer: Self-pay

## 2020-05-17 DIAGNOSIS — Z20828 Contact with and (suspected) exposure to other viral communicable diseases: Secondary | ICD-10-CM

## 2020-05-17 NOTE — Discharge Instructions (Signed)
Take medications as needed for cough Cepacol lozenges as needed will be helpful Because you have been sick for more than 5 days, you will not benefit from a course of tamiflu.

## 2020-05-17 NOTE — ED Triage Notes (Signed)
Pt is present today with a sore throat, cough, and body aches. Pt states that sx started last week and has gotten worse since then. Pt denies any fever

## 2020-05-19 NOTE — ED Provider Notes (Addendum)
[facility]    CSN: [id number] Arrival date & time: 05/17/20  1328      History   Chief Complaint Chief Complaint  Patient presents with  . Fatigue  . Sore Throat  . Generalized Body Aches    HPI Deborah Bennett is a 35 y.o. female comes to the urgent care with a 1 week history of fatigue, sore throat and generalized body aches.  Patient was exposed to influenza A positive individual about a week to 10 days ago.  Her family members had influenza A.  Symptoms started about a week 1 week ago and seem to have improved.  She had worsening sore throat yesterday and that necessitated a visit to the urgent care.  Today she denies any sore throat.  No body aches.  No fatigue.  No nausea, vomiting or diarrhea.Marland Kitchen   HPI  Past Medical History:  Diagnosis Date  . Anemia   . Headache(784.0)    otc med prn  . Vitamin D deficiency disease     Patient Active Problem List   Diagnosis Date Noted  . ROM (rupture of membranes), premature 03/14/2015  . Postpartum state 05/24/2013  . Pregnancy 05/23/2013    Past Surgical History:  Procedure Laterality Date  . HYSTEROSCOPY WITH D & C N/A 07/12/2012   Procedure: DILATATION AND CURETTAGE /HYSTEROSCOPY/CHROMTUBATION FULGERATION OF ENDOMETRIOSIS;  Surgeon: Freddrick March. Tenny Craw, MD;  Location: [facility];  Service: Gynecology;  Laterality: N/A;  . LAPAROSCOPY Left 07/12/2012   Procedure: LEFT OVARIAN CYSTECTOM WITH CHROMOPERTUBATION;  Surgeon: Freddrick March. Tenny Craw, MD;  Location: [facility];  Service: Gynecology;  Laterality: Left;  WITH LEFT OVARIAN CYSTECTOMY  . NO PAST SURGERIES    . OTHER SURGICAL HISTORY Left 07/12/2012   cyst removed from ovary    OB History    Gravida  2   Para  2   Term  2   Preterm      AB      Living  2     SAB      IAB      Ectopic      Multiple  0   Live Births  2            Home Medications    Prior to Admission medications   Medication Sig Start Date End Date Taking? Authorizing Provider   LOW-OGESTREL 0.3-30 MG-MCG tablet Take 1 tablet by mouth daily. 03/30/20   [provider]  ferrous sulfate 325 (65 FE) MG tablet Take 325 mg by mouth daily with breakfast.  05/17/20  [provider]    Family History Family History  Problem Relation Age of Onset  . Autism Cousin   . Hypertension Mother   . Arthritis Mother   . Hypertension Father   . Diabetes Father   . Arthritis Maternal Grandmother   . Miscarriages / Stillbirths Sister   . Stroke Paternal Grandmother     Social History Social History   Tobacco Use  . Smoking status: Never Smoker  . Smokeless tobacco: Never Used  Substance Use Topics  . Alcohol use: No  . Drug use: No     Allergies   Patient has no known allergies.   Review of Systems Review of Systems  Constitutional: Positive for fatigue.  HENT: Positive for sore throat.   Respiratory: Negative.   Cardiovascular: Negative.   Neurological: Negative.      Physical Exam Triage Vital Signs ED Triage Vitals  Enc Vitals Group  BP 05/17/20 1529 (!) 142/84     Pulse Rate 05/17/20 1529 88     Resp 05/17/20 1529 18     Temp 05/17/20 1529 98.1 F (36.7 C)     Temp Source 05/17/20 1529 Oral     SpO2 05/17/20 1529 97 %     Weight --      Height --      Head Circumference --      Peak Flow --      Pain Score 05/17/20 1525 0     Pain Loc --      Pain Edu? --      Excl. in [place]? --    No data found.  Updated Vital Signs BP (!) 142/84 (BP Location: Left Arm)   Pulse 88   Temp 98.1 F (36.7 C) (Oral)   Resp 18   LMP 05/05/2020   SpO2 97%   Breastfeeding No   Visual Acuity Right Eye Distance:   Left Eye Distance:   Bilateral Distance:    Right Eye Near:   Left Eye Near:    Bilateral Near:     Physical Exam Vitals and nursing note reviewed.  Constitutional:      General: She is not in acute distress.    Appearance: She is not ill-appearing.  HENT:     Right Ear: Tympanic membrane normal.     Left Ear:  Tympanic membrane normal.     Mouth/Throat:     Tonsils: 0 on the right. 0 on the left.  Cardiovascular:     Rate and Rhythm: Normal rate and regular rhythm.  Pulmonary:     Effort: Pulmonary effort is normal.     Breath sounds: Normal breath sounds.  Neurological:     Mental Status: She is alert.      UC Treatments / Results  Labs (all labs ordered are listed, but only abnormal results are displayed) Labs Reviewed - No data to display  EKG   Radiology No results found.  Procedures Procedures (including critical care time)  Medications Ordered in UC Medications - No data to display  Initial Impression / Assessment and Plan / UC Course  I have reviewed the triage vital signs and the nursing notes.  Pertinent labs & imaging results that were available during my care of the patient were reviewed by me and considered in my medical decision making (see chart for details).     1.  Flulike illness in the setting of exposure to influenza A: No indication for testing Patient most likely has influenza.  She will not benefit from Tamiflu because of onset of her diseases on 5 days ago Continue to hydrate or push oral fluids Tylenol/Motrin as needed for pain and/or fever Return to urgent care for reevaluation if symptoms worsen. Final Clinical Impressions(s) / UC Diagnoses   Final diagnoses:  Exposure to influenza     Discharge Instructions     Take medications as needed for cough Cepacol lozenges as needed will be helpful Because you have been sick for more than 5 days, you will not benefit from a course of tamiflu.   ED Prescriptions    None     PDMP not reviewed this encounter.   Merrilee Jansky, MD 05/19/20 1450    Merrilee Jansky, MD 05/19/20 1450

## 2020-08-07 ENCOUNTER — Other Ambulatory Visit: Payer: Self-pay

## 2020-08-07 ENCOUNTER — Encounter: Payer: Self-pay | Admitting: *Deleted

## 2020-08-07 ENCOUNTER — Ambulatory Visit
Admission: EM | Admit: 2020-08-07 | Discharge: 2020-08-07 | Disposition: A | Discharge status: Home / Self Care | Payer: Federal, State, Local not specified - PPO | Attending: Urgent Care | Admitting: Urgent Care

## 2020-08-07 DIAGNOSIS — R0982 Postnasal drip: Secondary | ICD-10-CM

## 2020-08-07 DIAGNOSIS — J069 Acute upper respiratory infection, unspecified: Secondary | ICD-10-CM | POA: Diagnosis not present

## 2020-08-07 MED ORDER — ACETAMINOPHEN 325 MG PO TABS
650.0000 mg | ORAL_TABLET | Freq: Once | ORAL | Status: AC
  Administered 2020-08-07: 650 mg via ORAL

## 2020-08-07 MED ORDER — PSEUDOEPHEDRINE HCL 60 MG PO TABS: 60.0000 mg | ORAL_TABLET | Freq: Three times a day (TID) | ORAL | 0 refills | Status: DC | PRN

## 2020-08-07 MED ORDER — CETIRIZINE HCL 10 MG PO TABS: 10.0000 mg | ORAL_TABLET | Freq: Every day | ORAL | 0 refills | Status: DC

## 2020-08-07 NOTE — Discharge Instructions (Addendum)

## 2020-08-07 NOTE — ED Provider Notes (Signed)
[facility]   MRN: [id number] DOB: 1985-09-11  Subjective:   Deborah Bennett is a 35 y.o. female presenting for 1 day history of fever, throat pain, painful swallowing.  Denies ear pain, runny or stuffy nose, cough, chest pain, shortness of breath.   Current Facility-Administered Medications:    acetaminophen (TYLENOL) tablet 650 mg, 650 mg, Oral, Once, Wallis Bamberg, PA-C  Current Outpatient Medications:    LOW-OGESTREL 0.3-30 MG-MCG tablet, Take 1 tablet by mouth daily., Disp: , Rfl:    No Known Allergies  Past Medical History:  Diagnosis Date   Anemia    Headache(784.0)    otc med prn   Vitamin D deficiency disease      Past Surgical History:  Procedure Laterality Date   HYSTEROSCOPY WITH D & C N/A 07/12/2012   Procedure: DILATATION AND CURETTAGE /HYSTEROSCOPY/CHROMTUBATION FULGERATION OF ENDOMETRIOSIS;  Surgeon: Freddrick March. Tenny Craw, MD;  Location: [facility];  Service: Gynecology;  Laterality: N/A;   LAPAROSCOPY Left 07/12/2012   Procedure: LEFT OVARIAN CYSTECTOM WITH CHROMOPERTUBATION;  Surgeon: Freddrick March. Tenny Craw, MD;  Location: [facility];  Service: Gynecology;  Laterality: Left;  WITH LEFT OVARIAN CYSTECTOMY   NO PAST SURGERIES     OTHER SURGICAL HISTORY Left 07/12/2012   cyst removed from ovary    Family History  Problem Relation Age of Onset   Hypertension Mother    Arthritis Mother    Hypertension Father    Diabetes Father    Miscarriages / [place] Sister    Arthritis Maternal Grandmother    Stroke Paternal Grandmother    Autism Cousin     Social History   Tobacco Use   Smoking status: Never   Smokeless tobacco: Never  Substance Use Topics   Alcohol use: No   Drug use: No    ROS   Objective:   Vitals: BP 124/85   Pulse 90   Temp (!) 100.6 F (38.1 C) (Oral)   Resp 20   LMP 08/06/2020   SpO2 99%   Physical Exam Constitutional:      General: She is not in acute distress.    Appearance: Normal appearance. She is well-developed. She is  not ill-appearing, toxic-appearing or diaphoretic.  HENT:     Head: Normocephalic and atraumatic.     Right Ear: Tympanic membrane and ear canal normal. No drainage or tenderness. No middle ear effusion. Tympanic membrane is not erythematous.     Left Ear: Tympanic membrane and ear canal normal. No drainage or tenderness.  No middle ear effusion. Tympanic membrane is not erythematous.     Nose: Nose normal. No congestion or rhinorrhea.     Mouth/Throat:     Mouth: Mucous membranes are moist. No oral lesions.     Pharynx: No pharyngeal swelling, oropharyngeal exudate, posterior oropharyngeal erythema or uvula swelling.     Tonsils: No tonsillar exudate or tonsillar abscesses.     Comments: Significant postnasal drainage overlying pharynx. Eyes:     Extraocular Movements: Extraocular movements intact.     Right eye: Normal extraocular motion.     Left eye: Normal extraocular motion.     Conjunctiva/sclera: Conjunctivae normal.     Pupils: Pupils are equal, round, and reactive to light.  Cardiovascular:     Rate and Rhythm: Normal rate and regular rhythm.     Pulses: Normal pulses.     Heart sounds: Normal heart sounds. No murmur heard.   No friction rub. No gallop.  Pulmonary:     Effort: Pulmonary effort  is normal. No respiratory distress.     Breath sounds: Normal breath sounds. No stridor. No wheezing, rhonchi or rales.  Musculoskeletal:     Cervical back: Normal range of motion and neck supple.  Lymphadenopathy:     Cervical: No cervical adenopathy.  Skin:    General: Skin is warm and dry.     Findings: No rash.  Neurological:     General: No focal deficit present.     Mental Status: She is alert and oriented to person, place, and time.  Psychiatric:        Mood and Affect: Mood normal.        Behavior: Behavior normal.        Thought Content: Thought content normal.    Patient given Tylenol in clinic.  Assessment and Plan :   PDMP not reviewed this encounter.  1.  Viral URI   2. Post-nasal drainage     Will manage for viral illness such as viral URI, viral syndrome, viral rhinitis, COVID-19. Counseled patient on nature of COVID-19 including modes of transmission, diagnostic testing, management and supportive care.  Offered scripts for symptomatic relief. COVID 19 testing is pending. Counseled patient on potential for adverse effects with medications prescribed/recommended today, ER and return-to-clinic precautions discussed, patient verbalized understanding.     Wallis Bamberg, [place] 08/07/20 [phone]

## 2020-08-07 NOTE — ED Triage Notes (Signed)
Pt reports Sx's started last night

## 2020-08-09 LAB — SARS-COV-2, NAA 2 DAY TAT

## 2020-08-09 LAB — NOVEL CORONAVIRUS, NAA: SARS-CoV-2, NAA: NOT DETECTED

## 2021-03-20 ENCOUNTER — Emergency Department (INDEPENDENT_AMBULATORY_CARE_PROVIDER_SITE_OTHER)
Admission: EM | Admit: 2021-03-20 | Discharge: 2021-03-20 | Disposition: A | Discharge status: Home / Self Care | Payer: BC Managed Care – PPO | Source: Home / Self Care | Attending: Family Medicine | Admitting: Family Medicine

## 2021-03-20 ENCOUNTER — Encounter: Payer: Self-pay | Admitting: Emergency Medicine

## 2021-03-20 ENCOUNTER — Other Ambulatory Visit: Payer: Self-pay

## 2021-03-20 DIAGNOSIS — J039 Acute tonsillitis, unspecified: Secondary | ICD-10-CM | POA: Diagnosis not present

## 2021-03-20 DIAGNOSIS — Z20818 Contact with and (suspected) exposure to other bacterial communicable diseases: Secondary | ICD-10-CM

## 2021-03-20 LAB — POCT RAPID STREP A (OFFICE): Rapid Strep A Screen: NEGATIVE

## 2021-03-20 MED ORDER — AMOXICILLIN 875 MG PO TABS: 875.0000 mg | ORAL_TABLET | Freq: Two times a day (BID) | ORAL | 0 refills | Status: AC

## 2021-03-20 NOTE — Discharge Instructions (Signed)
Take the amoxicillin 2 times a day for 10 full days ?Drink lots of fluids ?May take over-the-counter cough and cold medicines, may take Advil, Aleve, or Tylenol for pain ?Follow-up with your primary care doctor ?

## 2021-03-20 NOTE — ED Triage Notes (Signed)
Sore throat x 4 days  ?Recurrent strep and/or  tonsillitis  ?Tmax 101 ?OTC ibuprofen 400mg  at 1000 today  ?

## 2021-03-20 NOTE — ED Provider Notes (Signed)
?[facility] ? ? ? ?CSN: [id number] ?Arrival date & time: 03/20/21  1144 ? ? ?  ? ?History   ?Chief Complaint ?Chief Complaint  ?Patient presents with  ? Sore Throat  ? ? ?HPI ?Deborah Bennett is a 36 y.o. female.  ? ?HPI ? ?Patient states she has had a painful sore throat for 4 days.  She states she is had recurring problems with strep and tonsillitis.  Her temperatures been as high as 101.  She is here with her 39-year-old daughter who also has a sore throat and cough.  They both got strep tests and the strep test is positive for daughter, negative for mother.  Ongoing to presume they are both strep positive because of the contagiousness of streptococcal infections ? ?Past Medical History:  ?Diagnosis Date  ? Anemia   ? Headache(784.0)   ? otc med prn  ? Vitamin D deficiency disease   ? ? ?Patient Active Problem List  ? Diagnosis Date Noted  ? ROM (rupture of membranes), premature 03/14/2015  ? Postpartum state 05/24/2013  ? Pregnancy 05/23/2013  ? ? ?Past Surgical History:  ?Procedure Laterality Date  ? HYSTEROSCOPY WITH D & C N/A 07/12/2012  ? Procedure: DILATATION AND CURETTAGE /HYSTEROSCOPY/CHROMTUBATION FULGERATION OF ENDOMETRIOSIS;  Surgeon: Farrel Gobble. Harrington Challenger, MD;  Location: [facility];  Service: Gynecology;  Laterality: N/A;  ? LAPAROSCOPY Left 07/12/2012  ? Procedure: LEFT OVARIAN CYSTECTOM WITH CHROMOPERTUBATION;  Surgeon: Farrel Gobble. Harrington Challenger, MD;  Location: [facility];  Service: Gynecology;  Laterality: Left;  WITH LEFT OVARIAN CYSTECTOMY  ? NO PAST SURGERIES    ? OTHER SURGICAL HISTORY Left 07/12/2012  ? cyst removed from ovary  ? ? ?OB History   ? ? Gravida  ?2  ? Para  ?2  ? Term  ?2  ? Preterm  ?   ? AB  ?   ? Living  ?2  ?  ? ? SAB  ?   ? IAB  ?   ? Ectopic  ?   ? Multiple  ?0  ? Live Births  ?2  ?   ?  ?  ? ? ? ?Home Medications   ? ?Prior to Admission medications   ?Medication Sig Start Date End Date Taking? Authorizing Provider  ?amoxicillin (AMOXIL) 875 MG tablet Take 1 tablet (875 mg total) by mouth 2  (two) times daily for 10 days. 03/20/21 03/30/21 Yes Raylene Everts, MD  ?LOW-OGESTREL 0.3-30 MG-MCG tablet Take 1 tablet by mouth daily. 03/30/20   [provider]  ?ferrous sulfate 325 (65 FE) MG tablet Take 325 mg by mouth daily with breakfast.  05/17/20  [provider]  ? ? ?Family History ?Family History  ?Problem Relation Age of Onset  ? Hypertension Mother   ? Arthritis Mother   ? Hypertension Father   ? Diabetes Father   ? Miscarriages / Stillbirths Sister   ? Arthritis Maternal Grandmother   ? Stroke Paternal Grandmother   ? Autism Cousin   ? ? ?Social History ?Social History  ? ?Tobacco Use  ? Smoking status: Never  ? Smokeless tobacco: Never  ?Substance Use Topics  ? Alcohol use: No  ? Drug use: No  ? ? ? ?Allergies   ?Patient has no known allergies. ? ? ?Review of Systems ?Review of Systems ?See HPI ? ?Physical Exam ?Triage Vital Signs ?ED Triage Vitals  ?Enc Vitals Group  ?   BP 03/20/21 1217 129/87  ?   Pulse Rate 03/20/21 1217 Marland Kitchen)  106  ?   Resp 03/20/21 1217 17  ?   Temp 03/20/21 1217 99.3 ?F (37.4 ?C)  ?   Temp Source 03/20/21 1217 Oral  ?   SpO2 03/20/21 1217 98 %  ?   Weight 03/20/21 1212 215 lb (97.5 kg)  ?   Height 03/20/21 1212 5\' 8"  (1.727 m)  ?   Head Circumference --   ?   Peak Flow --   ?   Pain Score 03/20/21 1212 6  ?   Pain Loc --   ?   Pain Edu? --   ?   Excl. in [place]? --   ? ?No data found. ? ?Updated Vital Signs ?BP 129/87 (BP Location: Right Arm)   Pulse (!) 106   Temp 99.3 ?F (37.4 ?C) (Oral)   Resp 17   Ht 5\' 8"  (1.727 m)   Wt 97.5 kg   LMP 03/09/2021 (Exact Date)   SpO2 98%   BMI 32.69 kg/m?  ?   ? ?Physical Exam ?Constitutional:   ?   General: She is not in acute distress. ?   Appearance: She is well-developed.  ?HENT:  ?   Head: Normocephalic and atraumatic.  ?   Right Ear: Tympanic membrane and ear canal normal.  ?   Left Ear: Tympanic membrane and ear canal normal.  ?   Nose: No congestion.  ?   Mouth/Throat:  ?   Pharynx: Posterior oropharyngeal  erythema present.  ?Eyes:  ?   Conjunctiva/sclera: Conjunctivae normal.  ?   Pupils: Pupils are equal, round, and reactive to light.  ?Cardiovascular:  ?   Rate and Rhythm: Normal rate.  ?Pulmonary:  ?   Effort: Pulmonary effort is normal. No respiratory distress.  ?Abdominal:  ?   General: There is no distension.  ?   Palpations: Abdomen is soft.  ?Musculoskeletal:     ?   General: Normal range of motion.  ?   Cervical back: Normal range of motion.  ?Skin: ?   General: Skin is warm and dry.  ?Neurological:  ?   Mental Status: She is alert.  ? ? ? ?UC Treatments / Results  ?Labs ?(all labs ordered are listed, but only abnormal results are displayed) ?Labs Reviewed  ?POCT RAPID STREP A (OFFICE)  ? ? ?EKG ? ? ?Radiology ?No results found. ? ?Procedures ?Procedures (including critical care time) ? ?Medications Ordered in UC ?Medications - No data to display ? ?Initial Impression / Assessment and Plan / UC Course  ?I have reviewed the triage vital signs and the nursing notes. ? ?Pertinent labs & imaging results that were available during my care of the patient were reviewed by me and considered in my medical decision making (see chart for details). ? ?  ? ?Final Clinical Impressions(s) / UC Diagnoses  ? ?Final diagnoses:  ?Tonsillitis  ?Exposure to strep throat  ? ? ? ?Discharge Instructions   ? ?  ?Take the amoxicillin 2 times a day for 10 full days ?Drink lots of fluids ?May take over-the-counter cough and cold medicines, may take Advil, Aleve, or Tylenol for pain ?Follow-up with your primary care doctor ? ? ? ?ED Prescriptions   ? ? Medication Sig Dispense Auth. Provider  ? amoxicillin (AMOXIL) 875 MG tablet Take 1 tablet (875 mg total) by mouth 2 (two) times daily for 10 days. 20 tablet Raylene Everts, MD  ? ?  ? ?PDMP not reviewed this encounter. ?  ?Raylene Everts, MD ?03/20/21  1319 ? ?

## 2021-04-01 DIAGNOSIS — R222 Localized swelling, mass and lump, trunk: Secondary | ICD-10-CM | POA: Diagnosis not present

## 2021-04-01 DIAGNOSIS — Z6833 Body mass index (BMI) 33.0-33.9, adult: Secondary | ICD-10-CM | POA: Diagnosis not present

## 2021-04-04 ENCOUNTER — Other Ambulatory Visit: Payer: Self-pay | Admitting: Obstetrics and Gynecology

## 2021-04-04 DIAGNOSIS — R222 Localized swelling, mass and lump, trunk: Secondary | ICD-10-CM

## 2021-05-06 ENCOUNTER — Ambulatory Visit
Admission: RE | Admit: 2021-05-06 | Discharge: 2021-05-06 | Disposition: A | Discharge status: Home / Self Care | Payer: BC Managed Care – PPO | Source: Ambulatory Visit | Attending: Obstetrics and Gynecology | Admitting: Obstetrics and Gynecology

## 2021-05-06 ENCOUNTER — Other Ambulatory Visit: Payer: Self-pay | Admitting: Obstetrics and Gynecology

## 2021-05-06 DIAGNOSIS — R222 Localized swelling, mass and lump, trunk: Secondary | ICD-10-CM

## 2021-05-06 DIAGNOSIS — N6310 Unspecified lump in the right breast, unspecified quadrant: Secondary | ICD-10-CM

## 2021-09-16 ENCOUNTER — Encounter: Payer: Self-pay | Admitting: Family

## 2021-09-16 ENCOUNTER — Ambulatory Visit (INDEPENDENT_AMBULATORY_CARE_PROVIDER_SITE_OTHER): Payer: BC Managed Care – PPO | Admitting: Family

## 2021-09-16 VITALS — BP 134/70 | HR 82 | Temp 98.0°F | Ht 68.0 in | Wt 224.4 lb

## 2021-09-16 DIAGNOSIS — Z0000 Encounter for general adult medical examination without abnormal findings: Secondary | ICD-10-CM | POA: Diagnosis not present

## 2021-09-16 DIAGNOSIS — R7309 Other abnormal glucose: Secondary | ICD-10-CM | POA: Diagnosis not present

## 2021-09-16 DIAGNOSIS — Z13220 Encounter for screening for lipoid disorders: Secondary | ICD-10-CM | POA: Diagnosis not present

## 2021-09-16 DIAGNOSIS — R635 Abnormal weight gain: Secondary | ICD-10-CM

## 2021-09-16 LAB — CBC WITH DIFFERENTIAL/PLATELET
Basophils Absolute: 0 10*3/uL (ref 0.0–0.1)
Basophils Relative: 0.4 % (ref 0.0–3.0)
Eosinophils Absolute: 0.1 10*3/uL (ref 0.0–0.7)
Eosinophils Relative: 1.4 % (ref 0.0–5.0)
HCT: 34.6 % — ABNORMAL LOW (ref 36.0–46.0)
Hemoglobin: 10.8 g/dL — ABNORMAL LOW (ref 12.0–15.0)
Lymphocytes Relative: 31.3 % (ref 12.0–46.0)
Lymphs Abs: 2.6 10*3/uL (ref 0.7–4.0)
MCHC: 31 g/dL (ref 30.0–36.0)
MCV: 63.5 fl — ABNORMAL LOW (ref 78.0–100.0)
Monocytes Absolute: 0.5 10*3/uL (ref 0.1–1.0)
Monocytes Relative: 6.1 % (ref 3.0–12.0)
Neutro Abs: 5 10*3/uL (ref 1.4–7.7)
Neutrophils Relative %: 60.8 % (ref 43.0–77.0)
Platelets: 226 10*3/uL (ref 150.0–400.0)
RBC: 5.46 Mil/uL — ABNORMAL HIGH (ref 3.87–5.11)
RDW: 18.3 % — ABNORMAL HIGH (ref 11.5–15.5)
WBC: 8.3 10*3/uL (ref 4.0–10.5)

## 2021-09-16 LAB — LIPID PANEL
Cholesterol: 205 mg/dL — ABNORMAL HIGH (ref 0–200)
HDL: 55.5 mg/dL (ref >39.00)
LDL Cholesterol: 115 mg/dL — ABNORMAL HIGH (ref 0–99)
NonHDL: 149.59
Total CHOL/HDL Ratio: 4
Triglycerides: 174 mg/dL — ABNORMAL HIGH (ref 0.0–149.0)
VLDL: 34.8 mg/dL (ref 0.0–40.0)

## 2021-09-16 LAB — HEMOGLOBIN A1C: Hgb A1c MFr Bld: 6 % (ref 4.6–6.5)

## 2021-09-16 LAB — COMPREHENSIVE METABOLIC PANEL
ALT: 14 U/L (ref 0–35)
AST: 17 U/L (ref 0–37)
Albumin: 3.8 g/dL (ref 3.5–5.2)
Alkaline Phosphatase: 59 U/L (ref 39–117)
BUN: 13 mg/dL (ref 6–23)
CO2: 26 mEq/L (ref 19–32)
Calcium: 9.1 mg/dL (ref 8.4–10.5)
Chloride: 103 mEq/L (ref 96–112)
Creatinine, Ser: 0.99 mg/dL (ref 0.40–1.20)
GFR: 72.52 mL/min (ref >60.00)
Glucose, Bld: 94 mg/dL (ref 70–99)
Potassium: 4.1 mEq/L (ref 3.5–5.1)
Sodium: 137 mEq/L (ref 135–145)
Total Bilirubin: 0.4 mg/dL (ref 0.2–1.2)
Total Protein: 7.2 g/dL (ref 6.0–8.3)

## 2021-09-16 LAB — TSH: TSH: 2.66 u[IU]/mL (ref 0.35–5.50)

## 2021-09-16 NOTE — Progress Notes (Signed)
Deborah Bennett is a 36 y.o. female with the following history as recorded in EpicCare:  Patient Active Problem List   Diagnosis Date Noted   ROM (rupture of membranes), premature 03/14/2015   Postpartum state 05/24/2013   Pregnancy 05/23/2013    Current Outpatient Medications  Medication Sig Dispense Refill   LOW-OGESTREL 0.3-30 MG-MCG tablet Take 1 tablet by mouth daily.     No current facility-administered medications for this visit.    Allergies: Patient has no known allergies.  Past Medical History:  Diagnosis Date   Anemia    Headache(784.0)    otc med prn   Vitamin D deficiency disease     Past Surgical History:  Procedure Laterality Date   HYSTEROSCOPY WITH D & C N/A 07/12/2012   Procedure: DILATATION AND CURETTAGE /HYSTEROSCOPY/CHROMTUBATION FULGERATION OF ENDOMETRIOSIS;  Surgeon: Farrel Gobble. Harrington Challenger, MD;  Location: [facility];  Service: Gynecology;  Laterality: N/A;   LAPAROSCOPY Left 07/12/2012   Procedure: LEFT OVARIAN CYSTECTOM WITH CHROMOPERTUBATION;  Surgeon: Farrel Gobble. Harrington Challenger, MD;  Location: [facility];  Service: Gynecology;  Laterality: Left;  WITH LEFT OVARIAN CYSTECTOMY   NO PAST SURGERIES     OTHER SURGICAL HISTORY Left 07/12/2012   cyst removed from ovary    Family History  Problem Relation Age of Onset   Hypertension Mother    Arthritis Mother    Hypertension Father    Diabetes Father    Miscarriages / [place] Sister    Arthritis Maternal Grandmother    Stroke Paternal Grandmother    Autism Cousin    Breast cancer Neg Hx     Social History   Tobacco Use   Smoking status: Never   Smokeless tobacco: Never  Substance Use Topics   Alcohol use: No    Subjective:  Presents today as a new patient; requesting CPE today; is concerned that she is borderline diabetic- concerned about unexplained weight gain/ ? 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 Vanessa Kick;  Up to date on dentist and eye doctor;   LMP August 23  Review of Systems  Constitutional:  Positive for malaise/fatigue.  HENT: Negative.    Eyes: Negative.   Respiratory: Negative.    Cardiovascular: Negative.   Gastrointestinal: Negative.   Genitourinary: Negative.   Musculoskeletal: Negative.   Skin: Negative.   Neurological: Negative.   Endo/Heme/Allergies: Negative.   Psychiatric/Behavioral: Negative.       Objective:  Vitals:   09/16/21 0932  BP: 134/70  Pulse: 82  Temp: 98 F (36.7 C)  TempSrc: Oral  SpO2: 99%  Weight: 224 lb 6.4 oz (101.8 kg)  Height: 5' 8"  (1.727 m)    General: Well developed, well nourished, in no acute distress  Skin : Warm and dry.  Head: Normocephalic and atraumatic  Eyes: Sclera and conjunctiva clear; pupils round and reactive to light; extraocular movements intact  Ears: External normal; canals clear; tympanic membranes normal  Oropharynx: Pink, supple. No suspicious lesions  Neck: Supple without thyromegaly, adenopathy  Lungs: Respirations unlabored; clear to auscultation bilaterally without wheeze, rales, rhonchi  CVS exam: normal rate and regular rhythm.  Abdomen: Soft; nontender; nondistended; normoactive bowel sounds; no masses or hepatosplenomegaly  Musculoskeletal: No deformities; no active joint inflammation  Extremities: No edema, cyanosis, clubbing  Vessels: Symmetric bilaterally  Neurologic: Alert and  oriented; speech intact; face symmetrical; moves all extremities well; CNII-XII intact without focal deficit   Assessment:  1. PE (physical exam), annual   2. Lipid screening   3. Weight gain   4. Elevated glucose     Plan:  Age appropriate preventive healthcare needs addressed; encouraged regular eye doctor and dental exams; encouraged regular exercise; will update labs and refills as needed today; follow-up to be determined;   No follow-ups on  file.  Orders Placed This Encounter  Procedures   CBC with Differential/Platelet   Comp Met (CMET)   Lipid panel   TSH   Hemoglobin A1c    Requested Prescriptions    No prescriptions requested or ordered in this encounter

## 2021-09-21 ENCOUNTER — Telehealth: Payer: Self-pay | Admitting: Family

## 2021-09-21 NOTE — Telephone Encounter (Signed)
Pt called to go over lab results. All CMAs were occupied at the time. Pt stated she would like a call back to go over those results when possible.

## 2021-09-22 NOTE — Telephone Encounter (Signed)
I have called the pt to relay results    Regarding her labs:   1) She is pre-diabetic. She does need to be working on limiting intake of refined sugars/ processed fats. Would she be interested in meeting with nutritionist?  2) Lipids are mildly elevated but I know she wasn't fasting- regardless, dietary changes will be beneficial.  3) She is anemic- would recommend that she start taking daily MVI with iron. Would like to re-check her labs in 3 months.   Please let me know about nutritionist.   Pt stated that she wanted to do her own diet plan to start and if it is not working then she will do the nutritionist.  3 m f/u has been scheduled.

## 2021-11-07 ENCOUNTER — Other Ambulatory Visit: Payer: BC Managed Care – PPO

## 2021-12-23 ENCOUNTER — Ambulatory Visit (INDEPENDENT_AMBULATORY_CARE_PROVIDER_SITE_OTHER): Payer: BC Managed Care – PPO | Admitting: Family

## 2021-12-23 ENCOUNTER — Encounter: Payer: Self-pay | Admitting: Family

## 2021-12-23 VITALS — BP 132/74 | HR 94 | Temp 98.7°F | Ht 68.0 in | Wt 228.4 lb

## 2021-12-23 DIAGNOSIS — D509 Iron deficiency anemia, unspecified: Secondary | ICD-10-CM

## 2021-12-23 DIAGNOSIS — R7303 Prediabetes: Secondary | ICD-10-CM | POA: Diagnosis not present

## 2021-12-23 LAB — CBC WITH DIFFERENTIAL/PLATELET
Basophils Absolute: 0 10*3/uL (ref 0.0–0.1)
Basophils Relative: 0.4 % (ref 0.0–3.0)
Eosinophils Absolute: 0.2 10*3/uL (ref 0.0–0.7)
Eosinophils Relative: 1.8 % (ref 0.0–5.0)
HCT: 34.1 % — ABNORMAL LOW (ref 36.0–46.0)
Hemoglobin: 10.7 g/dL — ABNORMAL LOW (ref 12.0–15.0)
Lymphocytes Relative: 37 % (ref 12.0–46.0)
Lymphs Abs: 3.1 10*3/uL (ref 0.7–4.0)
MCHC: 31.4 g/dL (ref 30.0–36.0)
MCV: 62.7 fl — ABNORMAL LOW (ref 78.0–100.0)
Monocytes Absolute: 0.5 10*3/uL (ref 0.1–1.0)
Monocytes Relative: 5.9 % (ref 3.0–12.0)
Neutro Abs: 4.6 10*3/uL (ref 1.4–7.7)
Neutrophils Relative %: 54.9 % (ref 43.0–77.0)
Platelets: 233 10*3/uL (ref 150.0–400.0)
RBC: 5.44 Mil/uL — ABNORMAL HIGH (ref 3.87–5.11)
RDW: 17.4 % — ABNORMAL HIGH (ref 11.5–15.5)
WBC: 8.5 10*3/uL (ref 4.0–10.5)

## 2021-12-23 LAB — HEMOGLOBIN A1C: Hgb A1c MFr Bld: 6.4 % (ref 4.6–6.5)

## 2021-12-23 NOTE — Progress Notes (Signed)
  Deborah Bennett is a 36 y.o. female with the following history as recorded in EpicCare:  Patient Active Problem List   Diagnosis Date Noted   ROM (rupture of membranes), premature 03/14/2015   Postpartum state 05/24/2013   Pregnancy 05/23/2013    Current Outpatient Medications  Medication Sig Dispense Refill   LOW-OGESTREL 0.3-30 MG-MCG tablet Take 1 tablet by mouth daily.     No current facility-administered medications for this visit.    Allergies: Patient has no known allergies.  Past Medical History:  Diagnosis Date   Anemia    Headache(784.0)    otc med prn   Vitamin D deficiency disease     Past Surgical History:  Procedure Laterality Date   HYSTEROSCOPY WITH D & C N/A 07/12/2012   Procedure: DILATATION AND CURETTAGE /HYSTEROSCOPY/CHROMTUBATION FULGERATION OF ENDOMETRIOSIS;  Surgeon: Freddrick March. Tenny Craw, MD;  Location: [facility];  Service: Gynecology;  Laterality: N/A;   LAPAROSCOPY Left 07/12/2012   Procedure: LEFT OVARIAN CYSTECTOM WITH CHROMOPERTUBATION;  Surgeon: Freddrick March. Tenny Craw, MD;  Location: [facility];  Service: Gynecology;  Laterality: Left;  WITH LEFT OVARIAN CYSTECTOMY   NO PAST SURGERIES     OTHER SURGICAL HISTORY Left 07/12/2012   cyst removed from ovary    Family History  Problem Relation Age of Onset   Hypertension Mother    Arthritis Mother    Hypertension Father    Diabetes Father    Miscarriages / Stillbirths Sister    Arthritis Maternal Grandmother    Stroke Paternal Grandmother    Autism Cousin    Breast cancer Neg Hx     Social History   Tobacco Use   Smoking status: Never   Smokeless tobacco: Never  Substance Use Topics   Alcohol use: No    Subjective:  3 month follow up on pre-diabetes/ anemia; needed repeat labs; admits has not been able to be as effective with exercise/ weight loss program as she had hoped; would like to meet with nutrition; prefers not to use any type of medication at this time.   Objective:  Vitals:   12/23/21 0819  BP:  132/74  Pulse: 94  Temp: 98.7 F (37.1 C)  TempSrc: Oral  SpO2: 99%  Weight: 228 lb 6.4 oz (103.6 kg)  Height: 5\' 8"  (1.727 m)    General: Well developed, well nourished, in no acute distress  Skin : Warm and dry.  Head: Normocephalic and atraumatic  Lungs: Respirations unlabored;  Neurologic: Alert and oriented; speech intact; face symmetrical; moves all extremities well; CNII-XII intact without focal deficit   Assessment:  1. Pre-diabetes   2. Iron deficiency anemia, unspecified iron deficiency anemia type     Plan:  Update labs; refer to nutritionist; follow up to be determined; Update labs including anemia panel- per patient, chronic issues with anemia her entire life; could consider iron infusion;   No follow-ups on file.  Orders Placed This Encounter  Procedures   CBC with Differential/Platelet   Iron, TIBC and Ferritin Panel   Hemoglobin A1c   Amb ref to Medical Nutrition Therapy-MNT    Referral Priority:   Routine    Referral Type:   Consultation    Referral Reason:   Specialty Services Required    Requested Specialty:   Nutrition    Number of Visits Requested:   1    Requested Prescriptions    No prescriptions requested or ordered in this encounter

## 2021-12-24 LAB — IRON,TIBC AND FERRITIN PANEL
%SAT: 4 % (calc) — ABNORMAL LOW (ref 16–45)
Ferritin: <1 ng/mL — ABNORMAL LOW (ref 16–154)
Iron: 18 ug/dL — ABNORMAL LOW (ref 40–190)
TIBC: 440 mcg/dL (calc) (ref 250–450)

## 2021-12-29 ENCOUNTER — Ambulatory Visit: Payer: BC Managed Care – PPO | Admitting: Skilled Nursing Facility1

## 2022-05-09 ENCOUNTER — Other Ambulatory Visit: Payer: Self-pay | Admitting: Obstetrics and Gynecology

## 2022-05-09 DIAGNOSIS — N6310 Unspecified lump in the right breast, unspecified quadrant: Secondary | ICD-10-CM

## 2022-06-14 ENCOUNTER — Other Ambulatory Visit: Payer: BC Managed Care – PPO

## 2022-06-19 ENCOUNTER — Other Ambulatory Visit: Payer: BC Managed Care – PPO

## 2022-06-20 ENCOUNTER — Other Ambulatory Visit: Payer: Self-pay | Admitting: Family

## 2022-06-20 ENCOUNTER — Other Ambulatory Visit (INDEPENDENT_AMBULATORY_CARE_PROVIDER_SITE_OTHER): Payer: BC Managed Care – PPO

## 2022-06-20 ENCOUNTER — Ambulatory Visit: Admission: RE | Admit: 2022-06-20 | Payer: BC Managed Care – PPO | Source: Ambulatory Visit

## 2022-06-20 ENCOUNTER — Ambulatory Visit
Admission: RE | Admit: 2022-06-20 | Discharge: 2022-06-20 | Disposition: A | Discharge status: Home / Self Care | Payer: BC Managed Care – PPO | Source: Ambulatory Visit | Attending: Obstetrics and Gynecology | Admitting: Obstetrics and Gynecology

## 2022-06-20 DIAGNOSIS — R739 Hyperglycemia, unspecified: Secondary | ICD-10-CM

## 2022-06-20 DIAGNOSIS — N6310 Unspecified lump in the right breast, unspecified quadrant: Secondary | ICD-10-CM

## 2022-06-20 DIAGNOSIS — D509 Iron deficiency anemia, unspecified: Secondary | ICD-10-CM | POA: Diagnosis not present

## 2022-06-20 LAB — CBC WITH DIFFERENTIAL/PLATELET
Basophils Absolute: 0 10*3/uL (ref 0.0–0.1)
Basophils Relative: 0.6 % (ref 0.0–3.0)
Eosinophils Absolute: 0.1 10*3/uL (ref 0.0–0.7)
Eosinophils Relative: 1.1 % (ref 0.0–5.0)
HCT: 38.4 % (ref 36.0–46.0)
Hemoglobin: 11.6 g/dL — ABNORMAL LOW (ref 12.0–15.0)
Lymphocytes Relative: 38.2 % (ref 12.0–46.0)
Lymphs Abs: 2.8 10*3/uL (ref 0.7–4.0)
MCHC: 30.2 g/dL (ref 30.0–36.0)
MCV: 68.4 fl — ABNORMAL LOW (ref 78.0–100.0)
Monocytes Absolute: 0.5 10*3/uL (ref 0.1–1.0)
Monocytes Relative: 7.1 % (ref 3.0–12.0)
Neutro Abs: 3.9 10*3/uL (ref 1.4–7.7)
Neutrophils Relative %: 53 % (ref 43.0–77.0)
Platelets: 226 10*3/uL (ref 150.0–400.0)
RBC: 5.61 Mil/uL — ABNORMAL HIGH (ref 3.87–5.11)
RDW: 20.5 % — ABNORMAL HIGH (ref 11.5–15.5)
WBC: 7.4 10*3/uL (ref 4.0–10.5)

## 2022-06-20 LAB — IBC + FERRITIN
Ferritin: 2.9 ng/mL — ABNORMAL LOW (ref 10.0–291.0)
Iron: 37 ug/dL — ABNORMAL LOW (ref 42–145)
Saturation Ratios: 7.8 % — ABNORMAL LOW (ref 20.0–50.0)
TIBC: 473.2 ug/dL — ABNORMAL HIGH (ref 250.0–450.0)
Transferrin: 338 mg/dL (ref 212.0–360.0)

## 2022-06-20 LAB — HEMOGLOBIN A1C: Hgb A1c MFr Bld: 5.5 % (ref 4.6–6.5)

## 2022-06-20 NOTE — Addendum Note (Signed)
Addended by: Rosita Kea on: 06/20/2022 08:39 AM   Modules accepted: Orders

## 2022-06-30 ENCOUNTER — Encounter: Payer: Self-pay | Admitting: Family

## 2022-06-30 ENCOUNTER — Inpatient Hospital Stay: Payer: BC Managed Care – PPO

## 2022-06-30 ENCOUNTER — Inpatient Hospital Stay: Payer: BC Managed Care – PPO | Attending: Family | Admitting: Family

## 2022-06-30 VITALS — BP 126/78 | HR 72 | Temp 98.3°F | Ht 67.5 in | Wt 227.1 lb

## 2022-06-30 DIAGNOSIS — R202 Paresthesia of skin: Secondary | ICD-10-CM | POA: Insufficient documentation

## 2022-06-30 DIAGNOSIS — E559 Vitamin D deficiency, unspecified: Secondary | ICD-10-CM | POA: Diagnosis not present

## 2022-06-30 DIAGNOSIS — R0602 Shortness of breath: Secondary | ICD-10-CM | POA: Insufficient documentation

## 2022-06-30 DIAGNOSIS — D500 Iron deficiency anemia secondary to blood loss (chronic): Secondary | ICD-10-CM | POA: Diagnosis not present

## 2022-06-30 DIAGNOSIS — Z8041 Family history of malignant neoplasm of ovary: Secondary | ICD-10-CM | POA: Insufficient documentation

## 2022-06-30 DIAGNOSIS — M7989 Other specified soft tissue disorders: Secondary | ICD-10-CM | POA: Insufficient documentation

## 2022-06-30 DIAGNOSIS — N920 Excessive and frequent menstruation with regular cycle: Secondary | ICD-10-CM | POA: Diagnosis not present

## 2022-06-30 DIAGNOSIS — D509 Iron deficiency anemia, unspecified: Secondary | ICD-10-CM | POA: Insufficient documentation

## 2022-06-30 DIAGNOSIS — G4700 Insomnia, unspecified: Secondary | ICD-10-CM | POA: Insufficient documentation

## 2022-06-30 NOTE — Progress Notes (Signed)
Hematology/Oncology Consultation   Name: Deborah Bennett      MRN: [id number]    Location: Room/bed info not found  Date: 06/30/2022 Time:11:24 AM   REFERRING PHYSICIAN: Eustace Moore, FNP  REASON FOR CONSULT:  Iron deficiency anemia    DIAGNOSIS: Iron deficiency anemia secondary to heavy cycle  HISTORY OF PRESENT ILLNESS:  Deborah Bennett is a pleasant 37 yo female with long history of IDA originally diagnosed at age 57.  Iron saturation was 7% and ferritin 2.9 last week.  She is symptomatic with fatigue, weakness, SOB with exertion, insomnia at times, tingling in her fingertips in bed and intermittent swelling in the hands and feet that improves once she gets up in the morning.  She states that her cycle is regular with heavy flow at times. Since she started birth control clots are smaller.  No other blood loss noted. No bruising or petechiae.  She has history of endometriosis. She follows up with gynecology in July and plans to discuss her weight gain since starting oral birth control.  She is quite active enjoying outdoor activities as well as going to the gym for burn boot camp.  She is taking a prenatal vitamin but had to stop the oral iron supplement due to constipation.  She has 2 children and no history of miscarriage.  No personal history of cancer. Her mother had ovarian cancer.  No history of diabetes or thyroid disease.  No fever, chills, n/v, cough, rash, dizziness, chest pain, palpitations, abdominal pain or changes in bowel or bladder habits.  No pain in her extremities.  No falls or syncope reported.  Appetite and hydration are good. She rarely eats meat, her diet is mostly vegetarian. Weight is 227 lbs.  No smoking, ETOH or recreational drug use.  She teaches at a local high school.    ROS: All other 10 point review of systems is negative.   PAST MEDICAL HISTORY:   Past Medical History:  Diagnosis Date   Anemia    Headache(784.0)    otc med prn   Vitamin D deficiency  disease     ALLERGIES: No Known Allergies    MEDICATIONS:  Current Outpatient Medications on File Prior to Visit  Medication Sig Dispense Refill   LOW-OGESTREL 0.3-30 MG-MCG tablet Take 1 tablet by mouth daily.     OVER THE COUNTER MEDICATION Nutrafol 2 tablets daily     Prenatal Vit-Fe Fumarate-FA (PRENATAL VITAMIN PO) Take 1 tablet by mouth daily at 6 (six) AM. Has 27 mg of iron pt only takes 1 tablet so gets half dose     [DISCONTINUED] ferrous sulfate 325 (65 FE) MG tablet Take 325 mg by mouth daily with breakfast.     No current facility-administered medications on file prior to visit.     PAST SURGICAL HISTORY Past Surgical History:  Procedure Laterality Date   HYSTEROSCOPY WITH D & C N/A 07/12/2012   Procedure: DILATATION AND CURETTAGE /HYSTEROSCOPY/CHROMTUBATION FULGERATION OF ENDOMETRIOSIS;  Surgeon: Freddrick March. Tenny Craw, MD;  Location: [facility];  Service: Gynecology;  Laterality: N/A;   LAPAROSCOPY Left 07/12/2012   Procedure: LEFT OVARIAN CYSTECTOM WITH CHROMOPERTUBATION;  Surgeon: Freddrick March. Tenny Craw, MD;  Location: [facility];  Service: Gynecology;  Laterality: Left;  WITH LEFT OVARIAN CYSTECTOMY   NO PAST SURGERIES     OTHER SURGICAL HISTORY Left 07/12/2012   cyst removed from ovary    FAMILY HISTORY: Family History  Problem Relation Age of Onset   Hypertension Mother    Arthritis  Mother    Hypertension Father    Diabetes Father    Miscarriages / [place] Sister    Arthritis Maternal Grandmother    Stroke Paternal Grandmother    Autism Cousin    Breast cancer Neg Hx     SOCIAL HISTORY:  reports that she has never smoked. She has never used smokeless tobacco. She reports that she does not drink alcohol and does not use drugs.  PERFORMANCE STATUS: [name] performance status is 1 - Symptomatic but completely ambulatory  PHYSICAL EXAM: Most Recent Vital Signs: Blood pressure 126/78, pulse 72, temperature 98.3 F (36.8 C), temperature source Oral, height 5' 7.5"  (1.715 m), weight 227 lb 1.9 oz (103 kg), SpO2 100 %. BP 126/78 (BP Location: Right Arm, Patient Position: Sitting)   Pulse 72   Temp 98.3 F (36.8 C) (Oral)   Ht 5' 7.5" (1.715 m)   Wt 227 lb 1.9 oz (103 kg)   SpO2 100%   BMI 35.05 kg/m   General Appearance:    Alert, cooperative, no distress, appears stated age  Head:    Normocephalic, without obvious abnormality, atraumatic  Eyes:    PERRL, conjunctiva/corneas clear, EOM's intact, fundi    benign, both eyes        Throat:   Lips, mucosa, and tongue normal; teeth and gums normal  Neck:   Supple, symmetrical, trachea midline, no adenopathy;    thyroid:  no enlargement/tenderness/nodules; no carotid   bruit or JVD  Back:     Symmetric, no curvature, ROM normal, no CVA tenderness  Lungs:     Clear to auscultation bilaterally, respirations unlabored  Chest Wall:    No tenderness or deformity   Heart:    Regular rate and rhythm, S1 and S2 normal, no murmur, rub   or gallop     Abdomen:     Soft, non-tender, bowel sounds active all four quadrants,    no masses, no organomegaly        Extremities:   Extremities normal, atraumatic, no cyanosis or edema  Pulses:   2+ and symmetric all extremities  Skin:   Skin color, texture, turgor normal, no rashes or lesions  Lymph nodes:   Cervical, supraclavicular, and axillary nodes normal  Neurologic:   CNII-XII intact, normal strength, sensation and reflexes    throughout    LABORATORY DATA:  No results found for this or any previous visit (from the past 48 hour(s)).    RADIOGRAPHY: No results found.     PATHOLOGY: None  ASSESSMENT/PLAN:  Deborah Bennett is a pleasant 37 yo female with long history of IDA originally diagnosed at age 45.  We will get her set up for 3 doses of IV iron.  Follow-up in 8 weeks.   All questions were answered. The patient knows to call the clinic with any problems, questions or concerns. We can certainly see the patient much sooner if necessary.   Deborah Stanford, NP

## 2022-07-04 ENCOUNTER — Inpatient Hospital Stay: Payer: BC Managed Care – PPO | Attending: Hematology & Oncology

## 2022-07-04 VITALS — BP 133/76 | HR 77 | Temp 98.3°F | Resp 17

## 2022-07-04 DIAGNOSIS — D500 Iron deficiency anemia secondary to blood loss (chronic): Secondary | ICD-10-CM | POA: Diagnosis not present

## 2022-07-04 DIAGNOSIS — N920 Excessive and frequent menstruation with regular cycle: Secondary | ICD-10-CM | POA: Diagnosis not present

## 2022-07-04 MED ORDER — SODIUM CHLORIDE 0.9 % IV SOLN: Freq: Once | INTRAVENOUS | Status: AC

## 2022-07-04 MED ORDER — SODIUM CHLORIDE 0.9 % IV SOLN
300.0000 mg | Freq: Once | INTRAVENOUS | Status: AC
  Administered 2022-07-04: 300 mg via INTRAVENOUS
  Filled 2022-07-04: qty 300

## 2022-07-04 NOTE — Patient Instructions (Signed)

## 2022-07-11 ENCOUNTER — Inpatient Hospital Stay: Payer: BC Managed Care – PPO

## 2022-07-11 VITALS — BP 116/60 | HR 80 | Resp 16

## 2022-07-11 DIAGNOSIS — D500 Iron deficiency anemia secondary to blood loss (chronic): Secondary | ICD-10-CM | POA: Diagnosis not present

## 2022-07-11 DIAGNOSIS — N920 Excessive and frequent menstruation with regular cycle: Secondary | ICD-10-CM | POA: Diagnosis not present

## 2022-07-11 MED ORDER — SODIUM CHLORIDE 0.9 % IV SOLN: Freq: Once | INTRAVENOUS | Status: AC

## 2022-07-11 MED ORDER — SODIUM CHLORIDE 0.9 % IV SOLN
300.0000 mg | Freq: Once | INTRAVENOUS | Status: AC
  Administered 2022-07-11: 300 mg via INTRAVENOUS
  Filled 2022-07-11: qty 300

## 2022-07-11 NOTE — Patient Instructions (Signed)

## 2022-07-17 ENCOUNTER — Inpatient Hospital Stay: Payer: BC Managed Care – PPO

## 2022-07-17 VITALS — BP 135/70 | HR 83 | Temp 98.3°F

## 2022-07-17 DIAGNOSIS — D500 Iron deficiency anemia secondary to blood loss (chronic): Secondary | ICD-10-CM | POA: Diagnosis not present

## 2022-07-17 DIAGNOSIS — N920 Excessive and frequent menstruation with regular cycle: Secondary | ICD-10-CM | POA: Diagnosis not present

## 2022-07-17 MED ORDER — SODIUM CHLORIDE 0.9 % IV SOLN: Freq: Once | INTRAVENOUS | Status: AC

## 2022-07-17 MED ORDER — SODIUM CHLORIDE 0.9 % IV SOLN
300.0000 mg | Freq: Once | INTRAVENOUS | Status: AC
  Administered 2022-07-17: 300 mg via INTRAVENOUS
  Filled 2022-07-17: qty 300

## 2022-08-18 ENCOUNTER — Inpatient Hospital Stay: Payer: BC Managed Care – PPO | Attending: Hematology & Oncology

## 2022-08-18 ENCOUNTER — Encounter: Payer: Self-pay | Admitting: Family

## 2022-08-18 ENCOUNTER — Inpatient Hospital Stay: Payer: BC Managed Care – PPO | Admitting: Family

## 2022-08-18 ENCOUNTER — Other Ambulatory Visit: Payer: Self-pay

## 2022-08-18 VITALS — BP 121/76 | HR 80 | Temp 98.3°F | Resp 18

## 2022-08-18 DIAGNOSIS — D500 Iron deficiency anemia secondary to blood loss (chronic): Secondary | ICD-10-CM | POA: Insufficient documentation

## 2022-08-18 DIAGNOSIS — N920 Excessive and frequent menstruation with regular cycle: Secondary | ICD-10-CM | POA: Diagnosis present

## 2022-08-18 LAB — CBC WITH DIFFERENTIAL (CANCER CENTER ONLY)
Abs Immature Granulocytes: 0.12 10*3/uL — ABNORMAL HIGH (ref 0.00–0.07)
Basophils Absolute: 0 10*3/uL (ref 0.0–0.1)
Basophils Relative: 0 %
Eosinophils Absolute: 0.1 10*3/uL (ref 0.0–0.5)
Eosinophils Relative: 1 %
HCT: 41.7 % (ref 36.0–46.0)
Hemoglobin: 13.5 g/dL (ref 12.0–15.0)
Immature Granulocytes: 1 %
Lymphocytes Relative: 27 %
Lymphs Abs: 2.4 10*3/uL (ref 0.7–4.0)
MCH: 24.1 pg — ABNORMAL LOW (ref 26.0–34.0)
MCHC: 32.4 g/dL (ref 30.0–36.0)
MCV: 74.5 fL — ABNORMAL LOW (ref 80.0–100.0)
Monocytes Absolute: 0.4 10*3/uL (ref 0.1–1.0)
Monocytes Relative: 4 %
Neutro Abs: 6 10*3/uL (ref 1.7–7.7)
Neutrophils Relative %: 67 %
Platelet Count: 237 10*3/uL (ref 150–400)
RBC: 5.6 MIL/uL — ABNORMAL HIGH (ref 3.87–5.11)
RDW: 19.1 % — ABNORMAL HIGH (ref 11.5–15.5)
WBC Count: 8.9 10*3/uL (ref 4.0–10.5)
nRBC: 0 % (ref 0.0–0.2)

## 2022-08-18 LAB — RETICULOCYTES
Immature Retic Fract: 10 % (ref 2.3–15.9)
RBC.: 5.62 MIL/uL — ABNORMAL HIGH (ref 3.87–5.11)
Retic Count, Absolute: 65.8 10*3/uL (ref 19.0–186.0)
Retic Ct Pct: 1.2 % (ref 0.4–3.1)

## 2022-08-18 LAB — FERRITIN: Ferritin: 62 ng/mL (ref 11–307)

## 2022-08-18 NOTE — Progress Notes (Signed)
Hematology and Oncology Follow Up Visit  Deborah Bennett [id number] 1985-09-09 37 y.o. 08/18/2022   Principle Diagnosis:  Iron deficiency anemia secondary to heavy cycle   Current Therapy:   IV iron as indicated    Interim History:  Deborah Bennett is here today for follow-up. She is still noting some fatigue at times.  She did experience some joint and lower back pain for several days post infusion.  Her cycle was regular and no change in flow. No other blood loss noted. No bruising or petechiae.  No fever, chills, n/v, cough, rash, SOB, chest pain, palpitations, abdominal pain or changes in bowel or bladder habits.  She has dizziness at times when standing up quickly like when doing squats at the gym.  She notes swelling in her legs that waxes and wanes.  Positional numbness an tingling in her hands comes and goes.  No falls or syncope reported.  Appetite and hydration are good. Weight is 230 lbs.   ECOG Performance Status: 1 - Symptomatic but completely ambulatory  Medications:  Allergies as of 08/18/2022   No Known Allergies      Medication List        Accurate as of August 18, 2022  1:24 PM. If you have any questions, ask your nurse or doctor.          Low-Ogestrel 0.3-30 MG-MCG tablet Generic drug: norgestrel-ethinyl estradiol Take 1 tablet by mouth daily.   OVER THE COUNTER MEDICATION Nutrafol 2 tablets daily   PRENATAL VITAMIN PO Take 1 tablet by mouth daily at 6 (six) AM. Has 27 mg of iron pt only takes 1 tablet so gets half dose        Allergies: No Known Allergies  Past Medical History, Surgical history, Social history, and Family History were reviewed and updated.  Review of Systems: All other 10 point review of systems is negative.   Physical Exam:  vitals were not taken for this visit.   Wt Readings from Last 3 Encounters:  06/30/22 227 lb 1.9 oz (103 kg)  12/23/21 228 lb 6.4 oz (103.6 kg)  09/16/21 224 lb 6.4 oz (101.8 kg)    Ocular:  Sclerae unicteric, pupils equal, round and reactive to light Ear-nose-throat: Oropharynx clear, dentition fair Lymphatic: No cervical or supraclavicular adenopathy Lungs no rales or rhonchi, good excursion bilaterally Heart regular rate and rhythm, no murmur appreciated Abd soft, nontender, positive bowel sounds MSK no focal spinal tenderness, no joint edema Neuro: non-focal, well-oriented, appropriate affect Breasts: Deferred   Lab Results  Component Value Date   WBC 7.4 06/20/2022   HGB 11.6 (L) 06/20/2022   HCT 38.4 06/20/2022   MCV 68.4 Repeated and verified X2. (L) 06/20/2022   PLT 226.0 06/20/2022   Lab Results  Component Value Date   FERRITIN 2.9 (L) 06/20/2022   IRON 37 (L) 06/20/2022   TIBC 473.2 (H) 06/20/2022   IRONPCTSAT 7.8 (L) 06/20/2022   Lab Results  Component Value Date   RBC 5.61 (H) 06/20/2022   No results found for: "KPAFRELGTCHN", "LAMBDASER", "KAPLAMBRATIO" No results found for: "IGGSERUM", "IGA", "IGMSERUM" No results found for: "TOTALPROTELP", "ALBUMINELP", "A1GS", "A2GS", "BETS", "BETA2SER", "GAMS", "MSPIKE", "SPEI"   Chemistry      Component Value Date/Time   NA 137 09/16/2021 1001   K 4.1 09/16/2021 1001   CL 103 09/16/2021 1001   CO2 26 09/16/2021 1001   BUN 13 09/16/2021 1001   CREATININE 0.99 09/16/2021 1001      Component Value Date/Time  CALCIUM 9.1 09/16/2021 1001   ALKPHOS 59 09/16/2021 1001   AST 17 09/16/2021 1001   ALT 14 09/16/2021 1001   BILITOT 0.4 09/16/2021 1001       Impression and Plan: Deborah Bennett is a pleasant 37 yo female with long history of IDA originally diagnosed at age 49.  Iron studies are pending. We will replace if needed.  Follow-up in 3 months.  Eileen Stanford, NP 8/16/20241:24 PM

## 2022-08-21 LAB — IRON AND IRON BINDING CAPACITY (CC-WL,HP ONLY)
Iron: 109 ug/dL (ref 28–170)
Saturation Ratios: 34 % — ABNORMAL HIGH (ref 10.4–31.8)
TIBC: 321 ug/dL (ref 250–450)
UIBC: 212 ug/dL (ref 148–442)

## 2022-08-22 ENCOUNTER — Inpatient Hospital Stay: Payer: BC Managed Care – PPO | Admitting: Family

## 2022-08-22 ENCOUNTER — Inpatient Hospital Stay: Payer: BC Managed Care – PPO

## 2022-08-25 ENCOUNTER — Inpatient Hospital Stay: Payer: BC Managed Care – PPO | Admitting: Family

## 2022-08-25 ENCOUNTER — Inpatient Hospital Stay: Payer: BC Managed Care – PPO

## 2022-09-24 ENCOUNTER — Encounter: Payer: Self-pay | Admitting: Family

## 2022-11-07 ENCOUNTER — Inpatient Hospital Stay: Payer: BC Managed Care – PPO | Attending: Hematology & Oncology

## 2022-11-07 ENCOUNTER — Encounter: Payer: Self-pay | Admitting: Medical Oncology

## 2022-11-07 ENCOUNTER — Inpatient Hospital Stay (HOSPITAL_BASED_OUTPATIENT_CLINIC_OR_DEPARTMENT_OTHER): Payer: BC Managed Care – PPO | Admitting: Medical Oncology

## 2022-11-07 VITALS — BP 126/76 | HR 74 | Temp 98.7°F | Resp 17 | Wt 228.0 lb

## 2022-11-07 DIAGNOSIS — D500 Iron deficiency anemia secondary to blood loss (chronic): Secondary | ICD-10-CM | POA: Diagnosis not present

## 2022-11-07 DIAGNOSIS — N920 Excessive and frequent menstruation with regular cycle: Secondary | ICD-10-CM | POA: Insufficient documentation

## 2022-11-07 LAB — CBC WITH DIFFERENTIAL (CANCER CENTER ONLY)
Abs Immature Granulocytes: 0.01 10*3/uL (ref 0.00–0.07)
Basophils Absolute: 0 10*3/uL (ref 0.0–0.1)
Basophils Relative: 0 %
Eosinophils Absolute: 0 10*3/uL (ref 0.0–0.5)
Eosinophils Relative: 1 %
HCT: 41.4 % (ref 36.0–46.0)
Hemoglobin: 13.6 g/dL (ref 12.0–15.0)
Immature Granulocytes: 0 %
Lymphocytes Relative: 32 %
Lymphs Abs: 2.2 10*3/uL (ref 0.7–4.0)
MCH: 25.7 pg — ABNORMAL LOW (ref 26.0–34.0)
MCHC: 32.9 g/dL (ref 30.0–36.0)
MCV: 78.3 fL — ABNORMAL LOW (ref 80.0–100.0)
Monocytes Absolute: 0.4 10*3/uL (ref 0.1–1.0)
Monocytes Relative: 5 %
Neutro Abs: 4.3 10*3/uL (ref 1.7–7.7)
Neutrophils Relative %: 62 %
Platelet Count: 218 10*3/uL (ref 150–400)
RBC: 5.29 MIL/uL — ABNORMAL HIGH (ref 3.87–5.11)
RDW: 13.7 % (ref 11.5–15.5)
WBC Count: 7 10*3/uL (ref 4.0–10.5)
nRBC: 0 % (ref 0.0–0.2)

## 2022-11-07 LAB — IRON AND IRON BINDING CAPACITY (CC-WL,HP ONLY)
Iron: 120 ug/dL (ref 28–170)
Saturation Ratios: 37 % — ABNORMAL HIGH (ref 10.4–31.8)
TIBC: 326 ug/dL (ref 250–450)
UIBC: 206 ug/dL (ref 148–442)

## 2022-11-07 LAB — RETICULOCYTES
Immature Retic Fract: 7.9 % (ref 2.3–15.9)
RBC.: 5.25 MIL/uL — ABNORMAL HIGH (ref 3.87–5.11)
Retic Count, Absolute: 91.9 10*3/uL (ref 19.0–186.0)
Retic Ct Pct: 1.8 % (ref 0.4–3.1)

## 2022-11-07 LAB — FERRITIN: Ferritin: 43 ng/mL (ref 11–307)

## 2022-11-07 NOTE — Progress Notes (Signed)
Hematology and Oncology Follow Up Visit  Deborah Bennett [id number] 07-12-1985 37 y.o. 11/07/2022   Principle Diagnosis:  Iron deficiency anemia secondary to heavy cycle   Current Therapy:   IV iron as indicated - Venofer 300 mg- last dose 07/17/2022   Interim History:  Deborah Bennett is here today for follow-up.  Today she reports that she is feeling well. Mental fog has lifted.  Her cycle is heavier s/p iron infusion. Her menstrual cycles are 6-7 days in length. The first 4 days are normal in flow then spotting.  No other blood loss noted. No bruising or petechiae.  No fever, chills, n/v, cough, rash, SOB, chest pain, palpitations, abdominal pain or changes in bowel or bladder habits.  She has dizziness at times when standing up quickly like when doing squats at the gym.  The swelling in her legs has resolved.  Positional numbness an tingling in her hands comes and goes.  No falls or syncope reported.  Appetite and hydration are good. She has made some dietary changes (counting her calories and making better choices) Wt Readings from Last 3 Encounters:  11/07/22 228 lb (103.4 kg)  06/30/22 227 lb 1.9 oz (103 kg)  12/23/21 228 lb 6.4 oz (103.6 kg)   ECOG Performance Status: 1 - Symptomatic but completely ambulatory  Medications:  Allergies as of 11/07/2022   No Known Allergies      Medication List        Accurate as of November 07, 2022 12:13 PM. If you have any questions, ask your nurse or doctor.          Low-Ogestrel 0.3-30 MG-MCG tablet Generic drug: norgestrel-ethinyl estradiol Take 1 tablet by mouth daily.   OVER THE COUNTER MEDICATION Nutrafol 2 tablets daily   PRENATAL VITAMIN PO Take 1 tablet by mouth daily at 6 (six) AM. Has 27 mg of iron pt only takes 1 tablet so gets half dose        Allergies: No Known Allergies  Past Medical History, Surgical history, Social history, and Family History were reviewed and updated.  Review of Systems: All other  10 point review of systems is negative.   Physical Exam:  weight is 228 lb (103.4 kg). Her oral temperature is 98.7 F (37.1 C). Her blood pressure is 126/76 and her pulse is 74. Her respiration is 17 and oxygen saturation is 100%.   Wt Readings from Last 3 Encounters:  11/07/22 228 lb (103.4 kg)  06/30/22 227 lb 1.9 oz (103 kg)  12/23/21 228 lb 6.4 oz (103.6 kg)    Ocular: Sclerae unicteric, pupils equal, round and reactive to light Ear-nose-throat: Oropharynx clear, dentition fair Lymphatic: No cervical or supraclavicular adenopathy Lungs no rales or rhonchi, good excursion bilaterally Heart regular rate and rhythm, no murmur appreciated Abd soft, nontender, positive bowel sounds MSK no focal spinal tenderness, no joint edema Neuro: non-focal, well-oriented, appropriate affect  Lab Results  Component Value Date   WBC 7.0 11/07/2022   HGB 13.6 11/07/2022   HCT 41.4 11/07/2022   MCV 78.3 (L) 11/07/2022   PLT 218 11/07/2022   Lab Results  Component Value Date   FERRITIN 62 08/18/2022   IRON 109 08/18/2022   TIBC 321 08/18/2022   UIBC 212 08/18/2022   IRONPCTSAT 34 (H) 08/18/2022   Lab Results  Component Value Date   RETICCTPCT 1.8 11/07/2022   RBC 5.25 (H) 11/07/2022   RBC 5.29 (H) 11/07/2022   No results found for: "KPAFRELGTCHN", "LAMBDASER", "KAPLAMBRATIO" No results  found for: "IGGSERUM", "IGA", "IGMSERUM" No results found for: "TOTALPROTELP", "ALBUMINELP", "A1GS", "A2GS", "BETS", "BETA2SER", "GAMS", "MSPIKE", "SPEI"   Chemistry      Component Value Date/Time   NA 137 09/16/2021 1001   K 4.1 09/16/2021 1001   CL 103 09/16/2021 1001   CO2 26 09/16/2021 1001   BUN 13 09/16/2021 1001   CREATININE 0.99 09/16/2021 1001      Component Value Date/Time   CALCIUM 9.1 09/16/2021 1001   ALKPHOS 59 09/16/2021 1001   AST 17 09/16/2021 1001   ALT 14 09/16/2021 1001   BILITOT 0.4 09/16/2021 1001     Encounter Diagnosis  Name Primary?   Iron deficiency anemia due  to chronic blood loss Yes   Impression and Plan: Deborah Bennett is a pleasant 37 yo female with long history of IDA originally diagnosed at age 64. Currently on IV iron as well as oral prenatals.   CBC today shows a Hgb of 13.6, MCV of 78.3, normal platelets. No increase of immature reticulocytes  Iron studies are pending. We will replace if needed.   RTC 3 months APP, labs(CBC, ferritin, iron)-[place]  Rushie Chestnut, PA-C 11/5/202412:13 PM

## 2023-01-01 ENCOUNTER — Encounter: Payer: Self-pay | Admitting: Family

## 2023-02-07 ENCOUNTER — Inpatient Hospital Stay (HOSPITAL_BASED_OUTPATIENT_CLINIC_OR_DEPARTMENT_OTHER): Payer: 59 | Admitting: Medical Oncology

## 2023-02-07 ENCOUNTER — Inpatient Hospital Stay: Payer: Federal, State, Local not specified - PPO | Attending: Hematology & Oncology

## 2023-02-07 VITALS — BP 134/76 | HR 75 | Temp 98.7°F | Resp 17 | Wt 226.8 lb

## 2023-02-07 DIAGNOSIS — D500 Iron deficiency anemia secondary to blood loss (chronic): Secondary | ICD-10-CM

## 2023-02-07 DIAGNOSIS — N920 Excessive and frequent menstruation with regular cycle: Secondary | ICD-10-CM | POA: Insufficient documentation

## 2023-02-07 LAB — CBC
HCT: 42.2 % (ref 36.0–46.0)
Hemoglobin: 14 g/dL (ref 12.0–15.0)
MCH: 25.7 pg — ABNORMAL LOW (ref 26.0–34.0)
MCHC: 33.2 g/dL (ref 30.0–36.0)
MCV: 77.6 fL — ABNORMAL LOW (ref 80.0–100.0)
Platelets: 219 10*3/uL (ref 150–400)
RBC: 5.44 MIL/uL — ABNORMAL HIGH (ref 3.87–5.11)
RDW: 13.2 % (ref 11.5–15.5)
WBC: 11.4 10*3/uL — ABNORMAL HIGH (ref 4.0–10.5)
nRBC: 0 % (ref 0.0–0.2)

## 2023-02-07 LAB — IRON AND IRON BINDING CAPACITY (CC-WL,HP ONLY)
Iron: 78 ug/dL (ref 28–170)
Saturation Ratios: 21 % (ref 10.4–31.8)
TIBC: 371 ug/dL (ref 250–450)
UIBC: 293 ug/dL (ref 148–442)

## 2023-02-07 LAB — FERRITIN: Ferritin: 27 ng/mL (ref 11–307)

## 2023-02-07 NOTE — Progress Notes (Signed)
 Hematology and Oncology Follow Up Visit  Deborah Bennett [id number] 1985-03-31 38 y.o. 02/07/2023   Principle Diagnosis:  Iron  deficiency anemia secondary to heavy cycle   Current Therapy:   IV iron  as indicated - Venofer  300 mg- last dose 07/17/2022 Prenatal vitamin- 3 times per week.    Interim History:  Deborah Bennett is here today for follow-up.  Today she states that she is feeling well. Energy levels are much improved. Brain fog is much better.  Menstrual cycles are back to normal. [facility]- today No other blood loss noted. No bruising or petechiae.  No fever, chills, n/v, cough, rash, SOB, chest pain, palpitations, abdominal pain or changes in bowel or bladder habits.  Positional numbness an tingling in her hands comes and goes.  No falls or syncope reported.  Appetite and hydration are good. She has made some dietary changes (counting her calories and making better choices) Wt Readings from Last 3 Encounters:  02/07/23 226 lb 12.8 oz (102.9 kg)  11/07/22 228 lb (103.4 kg)  06/30/22 227 lb 1.9 oz (103 kg)   ECOG Performance Status: 1 - Symptomatic but completely ambulatory  Medications:  Allergies as of 02/07/2023   No Known Allergies      Medication List        Accurate as of February 07, 2023 11:49 AM. If you have any questions, ask your nurse or doctor.          Low-Ogestrel 0.3-30 MG-MCG tablet Generic drug: norgestrel-ethinyl estradiol Take 1 tablet by mouth daily.   OVER THE COUNTER MEDICATION Nutrafol 2 tablets daily   PRENATAL VITAMIN PO Take 1 tablet by mouth daily at 6 (six) AM. Has 27 mg of iron  pt only takes 1 tablet so gets half dose.  Patient taking 2-3 times a week        Allergies: No Known Allergies  Past Medical History, Surgical history, Social history, and Family History were reviewed and updated.  Review of Systems: All other 10 point review of systems is negative.   Physical Exam:  weight is 226 lb 12.8 oz (102.9 kg). Her oral  temperature is 98.7 F (37.1 C). Her blood pressure is 134/76 and her pulse is 75. Her respiration is 17 and oxygen saturation is 99%.   Wt Readings from Last 3 Encounters:  02/07/23 226 lb 12.8 oz (102.9 kg)  11/07/22 228 lb (103.4 kg)  06/30/22 227 lb 1.9 oz (103 kg)    Ocular: Sclerae unicteric, pupils equal, round and reactive to light Ear-nose-throat: Oropharynx clear, dentition fair Lymphatic: No cervical or supraclavicular adenopathy Lungs no rales or rhonchi, good excursion bilaterally Heart regular rate and rhythm, no murmur appreciated Abd soft, nontender, positive bowel sounds MSK no focal spinal tenderness, no joint edema Neuro: non-focal, well-oriented, appropriate affect  Lab Results  Component Value Date   WBC 11.4 (H) 02/07/2023   HGB 14.0 02/07/2023   HCT 42.2 02/07/2023   MCV 77.6 (L) 02/07/2023   PLT 219 02/07/2023   Lab Results  Component Value Date   FERRITIN 43 11/07/2022   IRON  120 11/07/2022   TIBC 326 11/07/2022   UIBC 206 11/07/2022   IRONPCTSAT 37 (H) 11/07/2022   Lab Results  Component Value Date   RETICCTPCT 1.8 11/07/2022   RBC 5.44 (H) 02/07/2023   No results found for: KPAFRELGTCHN, LAMBDASER, KAPLAMBRATIO No results found for: IGGSERUM, IGA, IGMSERUM No results found for: TOTALPROTELP, ALBUMINELP, A1GS, A2GS, BETS, BETA2SER, GAMS, MSPIKE, SPEI   Chemistry  Component Value Date/Time   NA 137 09/16/2021 1001   K 4.1 09/16/2021 1001   CL 103 09/16/2021 1001   CO2 26 09/16/2021 1001   BUN 13 09/16/2021 1001   CREATININE 0.99 09/16/2021 1001      Component Value Date/Time   CALCIUM 9.1 09/16/2021 1001   ALKPHOS 59 09/16/2021 1001   AST 17 09/16/2021 1001   ALT 14 09/16/2021 1001   BILITOT 0.4 09/16/2021 1001     Encounter Diagnosis  Name Primary?   Iron  deficiency anemia due to chronic blood loss Yes    Impression and Plan: Deborah Bennett is a pleasant 38 yo female with long history of IDA  originally diagnosed at age 7. Currently on IV iron  as well as oral prenatals.   CBC today shows a Hgb of 14, MCV of 77.6, normal platelets. WBC count up slightly to 11.4 but she is on her menstrual cycle and just getting over a viral illness.  Iron  studies are pending. We will replace if needed.   She has follow up with her PCP in 6 months with labs to reassess iron  levels. We will plan to get back together in the fall unless she becomes symptomatic again.   Lauraine CHRISTELLA Dais, PA-C 2/5/202511:49 AM

## 2023-02-08 ENCOUNTER — Encounter: Payer: Self-pay | Admitting: Medical Oncology

## 2023-02-20 ENCOUNTER — Encounter: Payer: Self-pay | Admitting: Family

## 2023-02-21 ENCOUNTER — Encounter: Payer: Self-pay | Admitting: Family

## 2023-07-03 ENCOUNTER — Ambulatory Visit (INDEPENDENT_AMBULATORY_CARE_PROVIDER_SITE_OTHER): Admitting: Family

## 2023-07-03 VITALS — BP 118/78 | HR 86 | Ht 67.5 in | Wt 231.2 lb

## 2023-07-03 DIAGNOSIS — D509 Iron deficiency anemia, unspecified: Secondary | ICD-10-CM | POA: Diagnosis not present

## 2023-07-03 DIAGNOSIS — Z0000 Encounter for general adult medical examination without abnormal findings: Secondary | ICD-10-CM

## 2023-07-03 DIAGNOSIS — R7303 Prediabetes: Secondary | ICD-10-CM | POA: Diagnosis not present

## 2023-07-03 DIAGNOSIS — Z13220 Encounter for screening for lipoid disorders: Secondary | ICD-10-CM

## 2023-07-03 NOTE — Progress Notes (Signed)
 Deborah Bennett is a 38 y.o. female with the following history as recorded in Epic[employer]:  Patient Active Problem List   Diagnosis Date Noted   IDA (iron  deficiency anemia) 06/30/2022   ROM (rupture of membranes), premature 03/14/2015   Postpartum state 05/24/2013   Pregnancy 05/23/2013    Current Outpatient Medications  Medication Sig Dispense Refill   LOW-OGESTREL 0.3-30 MG-MCG tablet Take 1 tablet by mouth daily.     OVER THE COUNTER MEDICATION Nutrafol 2 tablets daily     Prenatal Vit-Fe Fumarate-FA (PRENATAL VITAMIN PO) Take 1 tablet by mouth daily at 6 (six) AM. Has 27 mg of iron  pt only takes 1 tablet so gets half dose.  Patient taking 2-3 times a week     No current facility-administered medications for this visit.    Allergies: Patient has no known allergies.  Past Medical History:  Diagnosis Date   Anemia    Headache(784.0)    otc med prn   Vitamin D deficiency disease     Past Surgical History:  Procedure Laterality Date   HYSTEROSCOPY WITH D & C N/A 07/12/2012   Procedure: DILATATION AND CURETTAGE /HYSTEROSCOPY/CHROMTUBATION FULGERATION OF ENDOMETRIOSIS;  Surgeon: Marjorie DEL. Okey, MD;  Location: [facility];  Service: Gynecology;  Laterality: N/A;   LAPAROSCOPY Left 07/12/2012   Procedure: LEFT OVARIAN CYSTECTOM WITH CHROMOPERTUBATION;  Surgeon: Marjorie DEL. Okey, MD;  Location: [facility];  Service: Gynecology;  Laterality: Left;  WITH LEFT OVARIAN CYSTECTOMY   NO PAST SURGERIES     OTHER SURGICAL HISTORY Left 07/12/2012   cyst removed from ovary    Family History  Problem Relation Age of Onset   Hypertension Mother    Arthritis Mother    Hypertension Father    Diabetes Father    Miscarriages / [place] Sister    Arthritis Maternal Grandmother    Stroke Paternal Grandmother    Autism Cousin    Breast cancer Neg Hx     Social History   Tobacco Use   Smoking status: Never   Smokeless tobacco: Never  Substance Use Topics   Alcohol use: No    Subjective:    Presents for yearly CPE; does see GYN regularly- [facility]- pap smear was done last year/ mammogram is scheduled;  Does need to get iron  studies updated today;   Review of Systems  Constitutional: Negative.   HENT: Negative.    Eyes: Negative.   Respiratory: Negative.    Cardiovascular: Negative.   Gastrointestinal: Negative.   Genitourinary: Negative.   Musculoskeletal: Negative.   Skin: Negative.   Neurological: Negative.   Endo/Heme/Allergies: Negative.   Psychiatric/Behavioral: Negative.        Objective:  Vitals:   07/03/23 1320  BP: 118/78  Pulse: 86  SpO2: 98%  Weight: 231 lb 3.2 oz (104.9 kg)  Height: 5' 7.5 (1.715 m)    General: Well developed, well nourished, in no acute distress  Skin : Warm and dry.  Head: Normocephalic and atraumatic  Eyes: Sclera and conjunctiva clear; pupils round and reactive to light; extraocular movements intact  Ears: External normal; canals clear; tympanic membranes normal  Oropharynx: Pink, supple. No suspicious lesions  Neck: Supple without thyromegaly, adenopathy  Lungs: Respirations unlabored; clear to auscultation bilaterally without wheeze, rales, rhonchi  CVS exam: normal rate and regular rhythm.  Abdomen: Soft; nontender; nondistended; normoactive bowel sounds; no masses or hepatosplenomegaly  Musculoskeletal: No deformities; no active joint inflammation  Extremities: No edema, cyanosis, clubbing  Vessels: Symmetric bilaterally  Neurologic:  Alert and oriented; speech intact; face symmetrical; moves all extremities well; CNII-XII intact without focal deficit   Assessment:  1. PE (physical exam), annual   2. Lipid screening   3. Iron  deficiency anemia, unspecified iron  deficiency anemia type   4. Pre-diabetes     Plan:  Age appropriate preventive healthcare needs addressed; encouraged regular eye doctor and dental exams; encouraged regular exercise and healthy diet choices; will update labs and refills as needed  today; follow-up to be determined; She is scheduled to see her GYN and will have mammogram updated at that time;  No follow-ups on file.  Orders Placed This Encounter  Procedures   CBC with Differential/Platelet   Comp Met (CMET)   Lipid panel   IBC + Ferritin   Hemoglobin A1c    Requested Prescriptions    No prescriptions requested or ordered in this encounter

## 2023-07-04 ENCOUNTER — Ambulatory Visit: Payer: Self-pay | Admitting: Family

## 2023-07-04 LAB — COMPREHENSIVE METABOLIC PANEL WITH GFR
ALT: 19 U/L (ref 0–35)
AST: 18 U/L (ref 0–37)
Albumin: 4 g/dL (ref 3.5–5.2)
Alkaline Phosphatase: 69 U/L (ref 39–117)
BUN: 13 mg/dL (ref 6–23)
CO2: 27 meq/L (ref 19–32)
Calcium: 9 mg/dL (ref 8.4–10.5)
Chloride: 103 meq/L (ref 96–112)
Creatinine, Ser: 0.94 mg/dL (ref 0.40–1.20)
GFR: 76.21 mL/min (ref >60.00)
Glucose, Bld: 92 mg/dL (ref 70–99)
Potassium: 4.2 meq/L (ref 3.5–5.1)
Sodium: 137 meq/L (ref 135–145)
Total Bilirubin: 0.4 mg/dL (ref 0.2–1.2)
Total Protein: 7.1 g/dL (ref 6.0–8.3)

## 2023-07-04 LAB — CBC WITH DIFFERENTIAL/PLATELET
Basophils Absolute: 0.1 10*3/uL (ref 0.0–0.1)
Basophils Relative: 0.7 % (ref 0.0–3.0)
Eosinophils Absolute: 0.1 10*3/uL (ref 0.0–0.7)
Eosinophils Relative: 0.6 % (ref 0.0–5.0)
HCT: 42.2 % (ref 36.0–46.0)
Hemoglobin: 13.6 g/dL (ref 12.0–15.0)
Lymphocytes Relative: 22 % (ref 12.0–46.0)
Lymphs Abs: 2.6 10*3/uL (ref 0.7–4.0)
MCHC: 32.2 g/dL (ref 30.0–36.0)
MCV: 77.6 fl — ABNORMAL LOW (ref 78.0–100.0)
Monocytes Absolute: 0.7 10*3/uL (ref 0.1–1.0)
Monocytes Relative: 6.1 % (ref 3.0–12.0)
Neutro Abs: 8.3 10*3/uL — ABNORMAL HIGH (ref 1.4–7.7)
Neutrophils Relative %: 70.6 % (ref 43.0–77.0)
Platelets: 230 10*3/uL (ref 150.0–400.0)
RBC: 5.45 Mil/uL — ABNORMAL HIGH (ref 3.87–5.11)
RDW: 13.5 % (ref 11.5–15.5)
WBC: 11.7 10*3/uL — ABNORMAL HIGH (ref 4.0–10.5)

## 2023-07-04 LAB — LIPID PANEL
Cholesterol: 219 mg/dL — ABNORMAL HIGH (ref 0–200)
HDL: 55.7 mg/dL (ref >39.00)
LDL Cholesterol: 122 mg/dL — ABNORMAL HIGH (ref 0–99)
NonHDL: 163.02
Total CHOL/HDL Ratio: 4
Triglycerides: 206 mg/dL — ABNORMAL HIGH (ref 0.0–149.0)
VLDL: 41.2 mg/dL — ABNORMAL HIGH (ref 0.0–40.0)

## 2023-07-04 LAB — IBC + FERRITIN
Ferritin: 18.6 ng/mL (ref 10.0–291.0)
Iron: 147 ug/dL — ABNORMAL HIGH (ref 42–145)
Saturation Ratios: 43.2 % (ref 20.0–50.0)
TIBC: 340.2 ug/dL (ref 250.0–450.0)
Transferrin: 243 mg/dL (ref 212.0–360.0)

## 2023-07-04 LAB — HEMOGLOBIN A1C: Hgb A1c MFr Bld: 5.8 % (ref 4.6–6.5)

## 2023-07-15 IMAGING — MG DIGITAL DIAGNOSTIC BILAT W/ TOMO W/ CAD
6 of 12 series · 6 of 36 positions shown · non-contrast
Comparison: None available.

CLINICAL DATA: 35-year-old female with palpable thickening in the
LEFT axilla identified on self-examination. Baseline mammogram.

EXAM:
DIGITAL DIAGNOSTIC BILATERAL MAMMOGRAM WITH TOMOSYNTHESIS AND CAD;
ULTRASOUND RIGHT BREAST LIMITED; US AXILLARY LEFT
TECHNIQUE: Bilateral digital diagnostic mammography and breast tomosynthesis
was performed. The images were evaluated with computer-aided
detection.; Targeted ultrasound examination of the right breast was
performed; Targeted ultrasound examination of the left axilla was
performed.

[L CC synth-2D (1 of 2)]
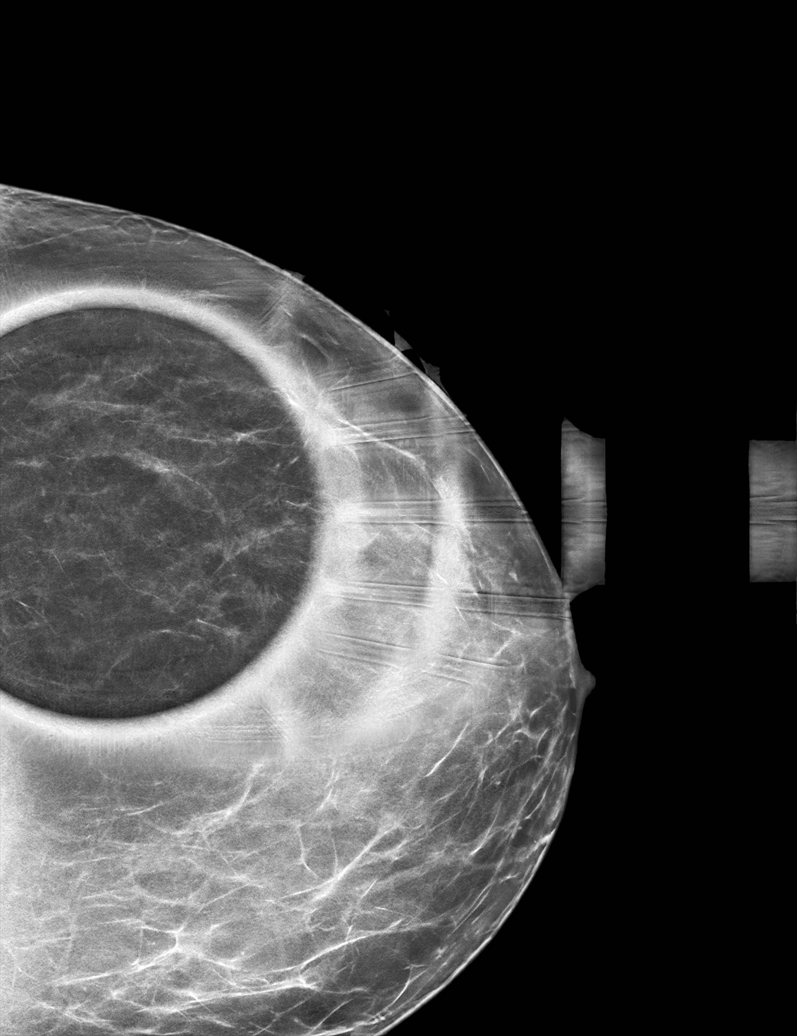

[R MLO synth-2D]
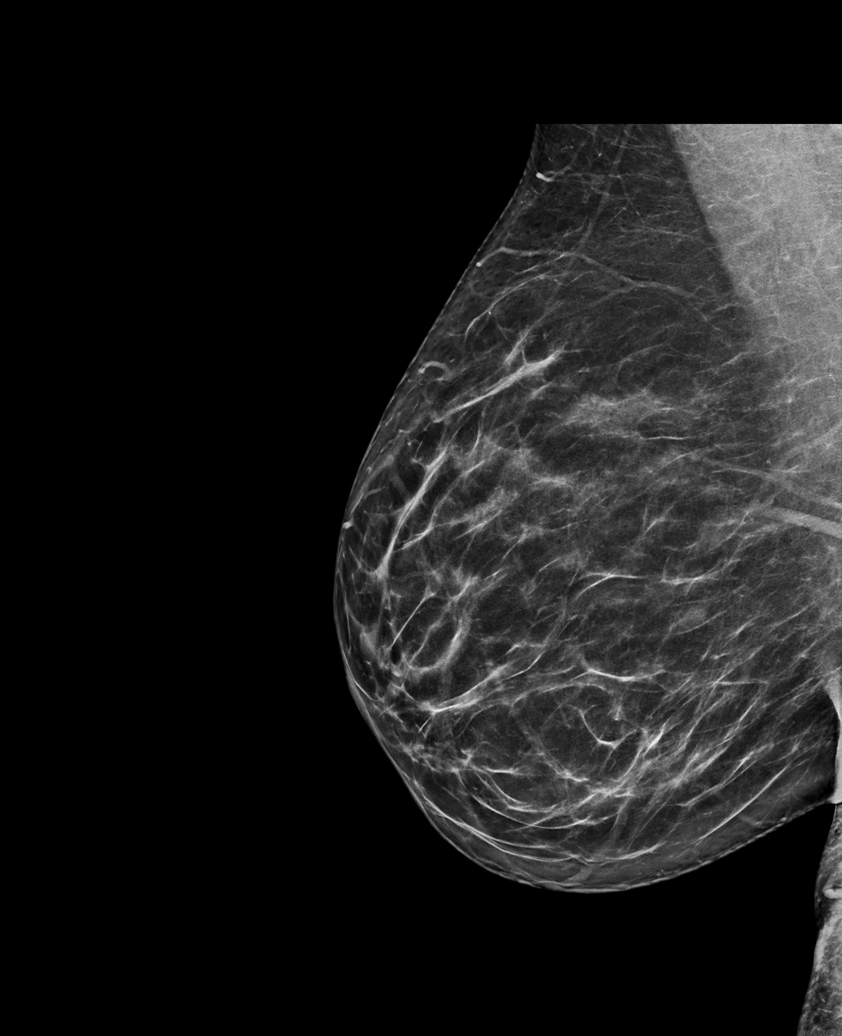

[R CC synth-2D]
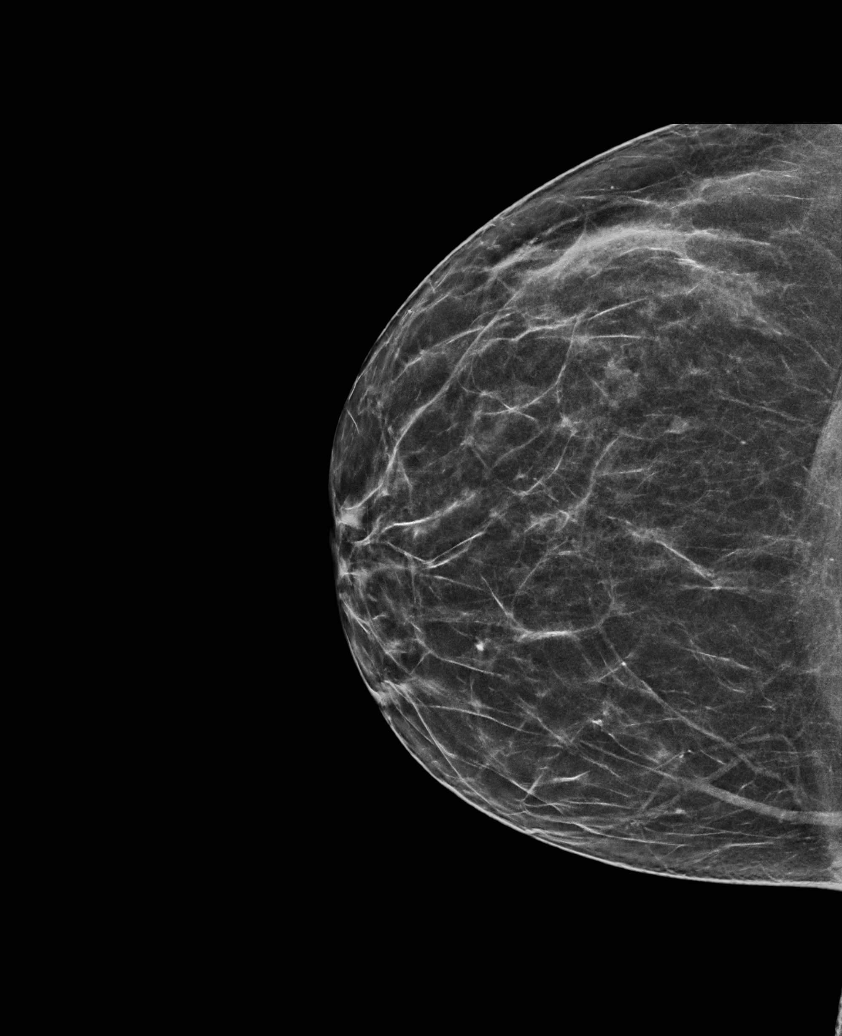

[L MLO synth-2D]
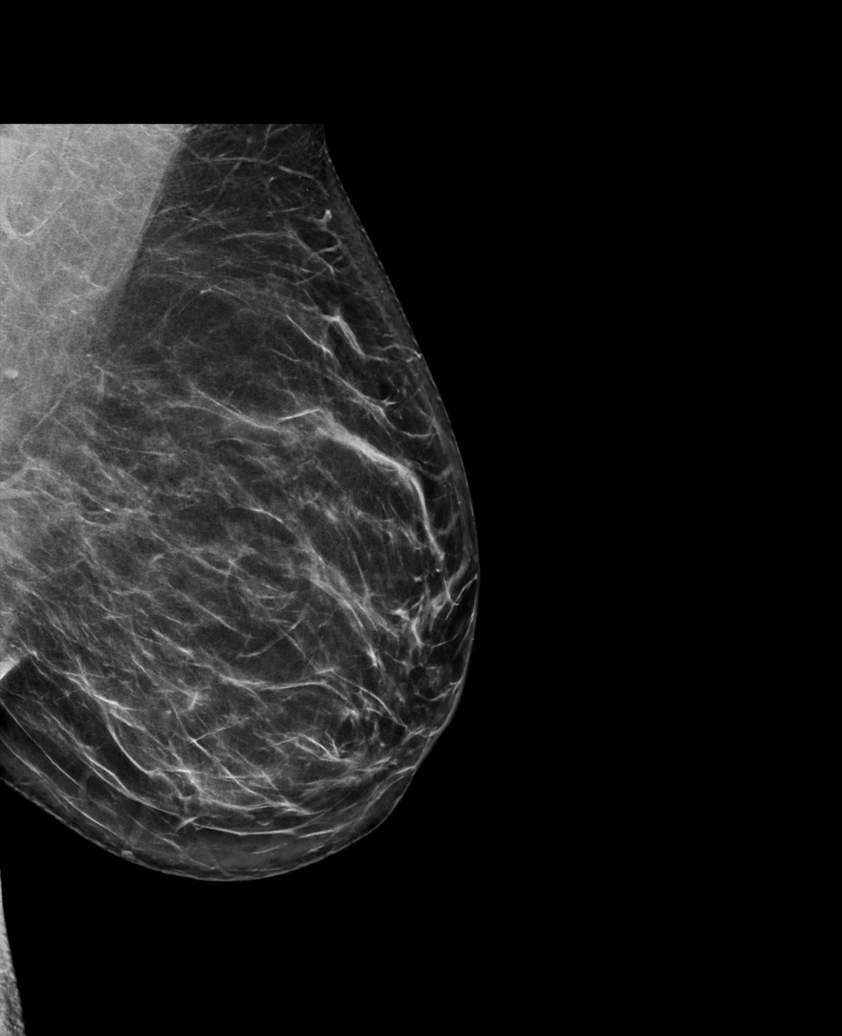

[L TAN synth-2D]
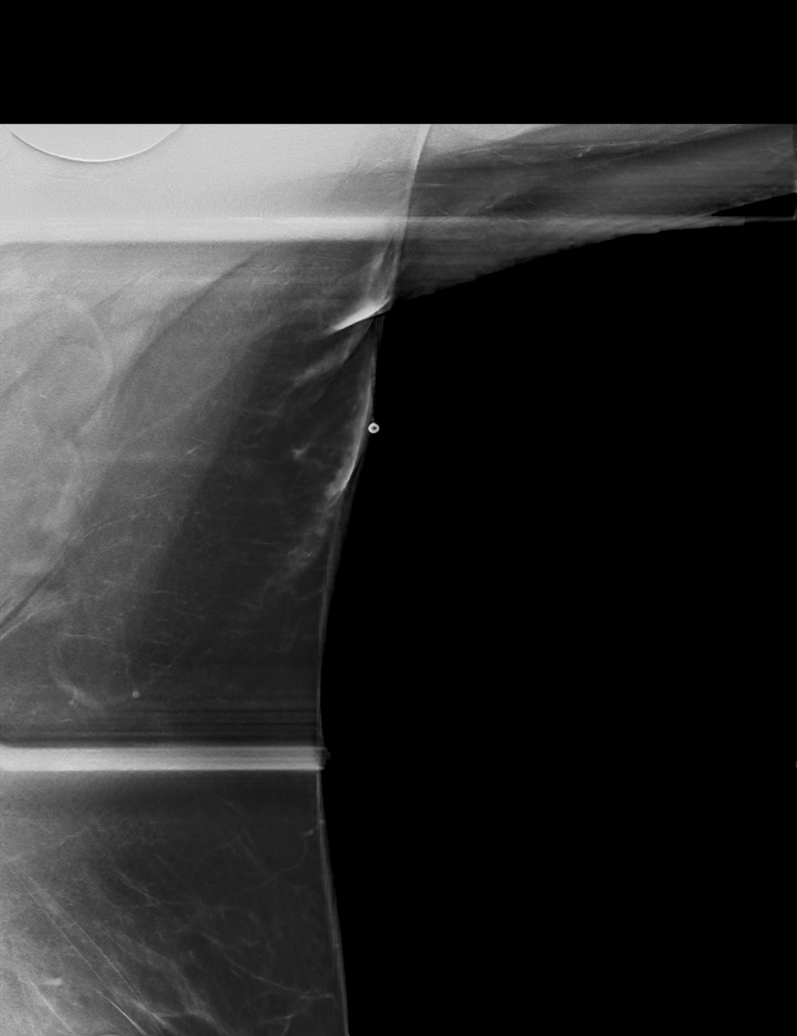

[L CC synth-2D (2 of 2)]
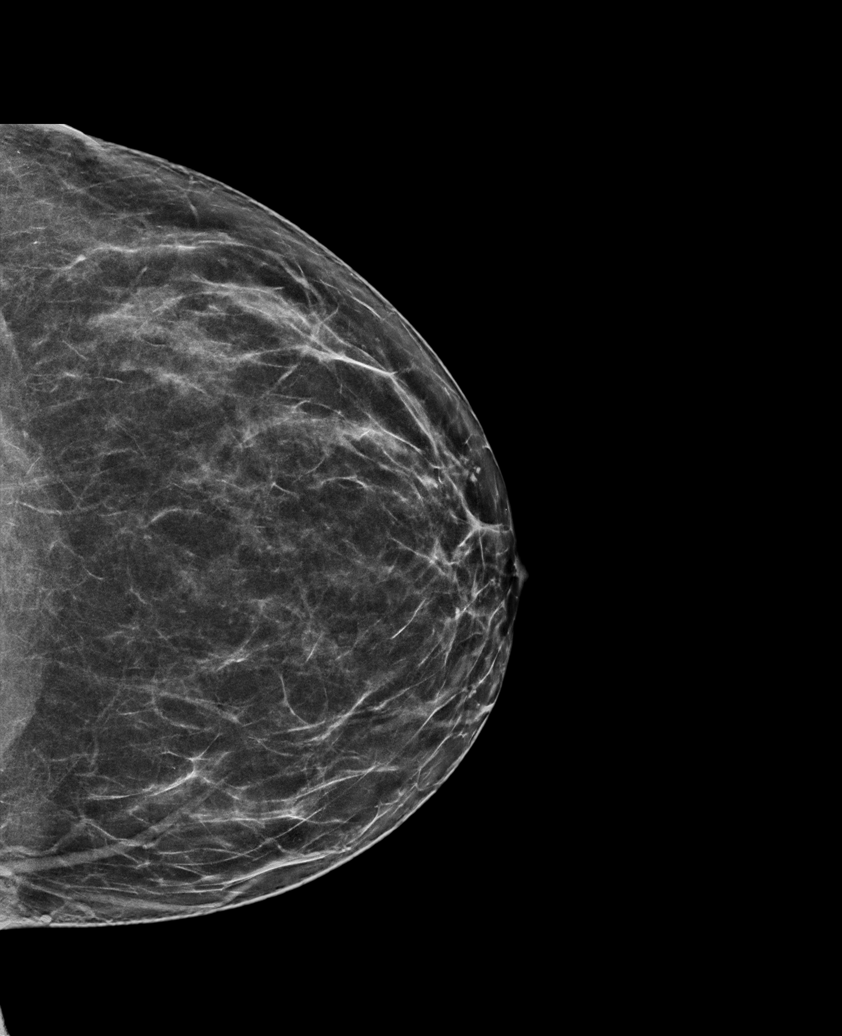

[6 of 36 positions shown; findings below may reference images not displayed]

ACR Breast Density Category b: There are scattered areas of
fibroglandular density.
FINDINGS: 2D/3D full field views of both breasts and spot compression view of
the LEFT breast demonstrate a 0.3 cm circumscribed oval mass within
the posterior slightly OUTER RIGHT breast.

No other mass, distortion or worrisome calcifications are noted
within either breast.

Targeted ultrasound is performed, showing no sonographic
abnormalities within the entire RIGHT breast, with very thorough
interrogation in the expected area of the mammographic mass.
IMPRESSION: 1. 0.3 cm circumscribed oval RIGHT breast mass, which was not
identified sonographically. Given that this is the patient's
baseline study and mammographic findings suggest benignity, options
short-term follow-up and tissue sampling were presented to the
patient. We both agreed to proceed with short-term follow-up.
2. No mammographic or sonographic abnormality within the LEFT
axilla, in the area of patient concern.
3. No other mammographic abnormalities within either breast.

RECOMMENDATION:
RIGHT diagnostic mammogram with possible RIGHT breast ultrasound in
6 months.

I have discussed the findings and recommendations with the patient.
If applicable, a reminder letter will be sent to the patient
regarding the next appointment.

BI-RADS CATEGORY  3: Probably benign.

## 2023-10-09 ENCOUNTER — Ambulatory Visit: Payer: Federal, State, Local not specified - PPO | Admitting: Medical Oncology

## 2023-10-09 ENCOUNTER — Inpatient Hospital Stay: Payer: 59

## 2024-07-08 ENCOUNTER — Encounter: Admitting: Family Medicine
